# Patient Record
Sex: Female | Born: 1975
Health system: Southern US, Community
[De-identification: ages and names within clinical notes are randomized; demographics above are authoritative.]

## PROBLEM LIST (undated history)

## (undated) ENCOUNTER — Inpatient Hospital Stay (HOSPITAL_COMMUNITY): Payer: Self-pay

## (undated) DIAGNOSIS — R87629 Unspecified abnormal cytological findings in specimens from vagina: Secondary | ICD-10-CM

## (undated) DIAGNOSIS — R000 Tachycardia, unspecified: Secondary | ICD-10-CM

## (undated) DIAGNOSIS — I471 Supraventricular tachycardia, unspecified: Secondary | ICD-10-CM

## (undated) HISTORY — PX: LEEP: SHX91

---

## 1997-09-23 ENCOUNTER — Other Ambulatory Visit: Admission: RE | Admit: 1997-09-23 | Discharge: 1997-09-23 | Payer: Self-pay | Admitting: *Deleted

## 2004-10-05 ENCOUNTER — Emergency Department (HOSPITAL_COMMUNITY): Admission: EM | Admit: 2004-10-05 | Discharge: 2004-10-06 | Payer: Self-pay | Admitting: Emergency Medicine

## 2006-03-10 ENCOUNTER — Emergency Department (HOSPITAL_COMMUNITY): Admission: EM | Admit: 2006-03-10 | Discharge: 2006-03-11 | Payer: Self-pay | Admitting: Emergency Medicine

## 2007-02-25 ENCOUNTER — Inpatient Hospital Stay (HOSPITAL_COMMUNITY): Admission: AD | Admit: 2007-02-25 | Discharge: 2007-02-25 | Payer: Self-pay | Admitting: Obstetrics and Gynecology

## 2008-08-08 ENCOUNTER — Emergency Department (HOSPITAL_COMMUNITY): Admission: EM | Admit: 2008-08-08 | Discharge: 2008-08-08 | Payer: Self-pay | Admitting: Emergency Medicine

## 2009-04-12 ENCOUNTER — Inpatient Hospital Stay (HOSPITAL_COMMUNITY): Admission: AD | Admit: 2009-04-12 | Discharge: 2009-04-15 | Payer: Self-pay | Admitting: Obstetrics and Gynecology

## 2010-03-12 ENCOUNTER — Other Ambulatory Visit: Payer: Self-pay | Admitting: Obstetrics and Gynecology

## 2010-03-12 ENCOUNTER — Inpatient Hospital Stay (HOSPITAL_COMMUNITY)
Admission: AD | Admit: 2010-03-12 | Discharge: 2010-03-14 | DRG: 775 | Disposition: A | Discharge status: Home / Self Care | Payer: Self-pay | Source: Ambulatory Visit | Attending: Obstetrics and Gynecology | Admitting: Obstetrics and Gynecology

## 2010-03-12 ENCOUNTER — Inpatient Hospital Stay (HOSPITAL_COMMUNITY): Payer: Self-pay

## 2010-03-12 DIAGNOSIS — O4290 Premature rupture of membranes, unspecified as to length of time between rupture and onset of labor, unspecified weeks of gestation: Principal | ICD-10-CM | POA: Diagnosis present

## 2010-03-12 DIAGNOSIS — O99892 Other specified diseases and conditions complicating childbirth: Secondary | ICD-10-CM | POA: Diagnosis present

## 2010-03-12 DIAGNOSIS — O320XX0 Maternal care for unstable lie, not applicable or unspecified: Secondary | ICD-10-CM | POA: Diagnosis present

## 2010-03-12 DIAGNOSIS — O9903 Anemia complicating the puerperium: Secondary | ICD-10-CM | POA: Diagnosis not present

## 2010-03-12 DIAGNOSIS — D649 Anemia, unspecified: Secondary | ICD-10-CM | POA: Diagnosis not present

## 2010-03-12 DIAGNOSIS — Z22330 Carrier of Group B streptococcus: Secondary | ICD-10-CM

## 2010-03-12 LAB — URINALYSIS, DIPSTICK ONLY
Bilirubin Urine: NEGATIVE
Hgb urine dipstick: NEGATIVE
Ketones, ur: >80 mg/dL — AB
Leukocytes, UA: NEGATIVE
Nitrite: NEGATIVE
Protein, ur: NEGATIVE mg/dL
Specific Gravity, Urine: 1.02 (ref 1.005–1.030)
Urine Glucose, Fasting: NEGATIVE mg/dL
Urobilinogen, UA: 0.2 mg/dL (ref 0.0–1.0)
pH: 6.5 (ref 5.0–8.0)

## 2010-03-12 LAB — AMNISURE RUPTURE OF MEMBRANE (ROM) NOT AT ARMC: Amnisure ROM: POSITIVE

## 2010-03-14 LAB — CBC
HCT: 31.3 % — ABNORMAL LOW (ref 36.0–46.0)
Hemoglobin: 9.9 g/dL — ABNORMAL LOW (ref 12.0–15.0)
MCH: 28.4 pg (ref 26.0–34.0)
MCHC: 31.6 g/dL (ref 30.0–36.0)
MCV: 89.7 fL (ref 78.0–100.0)
Platelets: 191 10*3/uL (ref 150–400)
RBC: 3.49 MIL/uL — ABNORMAL LOW (ref 3.87–5.11)
RDW: 13.5 % (ref 11.5–15.5)
WBC: 5.5 10*3/uL (ref 4.0–10.5)

## 2010-03-14 LAB — RPR: RPR Ser Ql: NONREACTIVE

## 2010-03-26 ENCOUNTER — Inpatient Hospital Stay (HOSPITAL_COMMUNITY): Admission: AD | Admit: 2010-03-26 | Payer: Self-pay | Source: Home / Self Care | Admitting: Obstetrics and Gynecology

## 2010-03-28 LAB — DIFFERENTIAL
Basophils Absolute: 0 10*3/uL (ref 0.0–0.1)
Basophils Relative: 0 % (ref 0–1)
Eosinophils Absolute: 0 10*3/uL (ref 0.0–0.7)
Eosinophils Relative: 0 % (ref 0–5)
Lymphocytes Relative: 7 % — ABNORMAL LOW (ref 12–46)
Lymphs Abs: 0.3 10*3/uL — ABNORMAL LOW (ref 0.7–4.0)
Monocytes Absolute: 0.2 10*3/uL (ref 0.1–1.0)
Monocytes Relative: 4 % (ref 3–12)
Neutro Abs: 3.8 10*3/uL (ref 1.7–7.7)
Neutrophils Relative %: 89 % — ABNORMAL HIGH (ref 43–77)

## 2010-03-28 LAB — CBC
HCT: 33.3 % — ABNORMAL LOW (ref 36.0–46.0)
Hemoglobin: 10.8 g/dL — ABNORMAL LOW (ref 12.0–15.0)
MCH: 29 pg (ref 26.0–34.0)
MCHC: 32.4 g/dL (ref 30.0–36.0)
MCV: 89.3 fL (ref 78.0–100.0)
Platelets: 187 10*3/uL (ref 150–400)
RBC: 3.73 MIL/uL — ABNORMAL LOW (ref 3.87–5.11)
RDW: 13.2 % (ref 11.5–15.5)
WBC: 4.3 10*3/uL (ref 4.0–10.5)

## 2010-03-28 LAB — COMPREHENSIVE METABOLIC PANEL
ALT: 11 U/L (ref 0–35)
AST: 16 U/L (ref 0–37)
Albumin: 2.7 g/dL — ABNORMAL LOW (ref 3.5–5.2)
Alkaline Phosphatase: 117 U/L (ref 39–117)
BUN: 7 mg/dL (ref 6–23)
CO2: 22 mEq/L (ref 19–32)
Calcium: 8.6 mg/dL (ref 8.4–10.5)
Chloride: 106 mEq/L (ref 96–112)
Creatinine, Ser: 0.5 mg/dL (ref 0.4–1.2)
GFR calc Af Amer: >60 mL/min (ref >60)
GFR calc non Af Amer: >60 mL/min (ref >60)
Glucose, Bld: 88 mg/dL (ref 70–99)
Potassium: 4.1 mEq/L (ref 3.5–5.1)
Sodium: 136 mEq/L (ref 135–145)
Total Bilirubin: 0.9 mg/dL (ref 0.3–1.2)
Total Protein: 6 g/dL (ref 6.0–8.3)

## 2010-03-28 LAB — RPR: RPR Ser Ql: NONREACTIVE

## 2010-04-17 LAB — CBC
HCT: 27.9 % — ABNORMAL LOW (ref 36.0–46.0)
HCT: 35.6 % — ABNORMAL LOW (ref 36.0–46.0)
Hemoglobin: 11.6 g/dL — ABNORMAL LOW (ref 12.0–15.0)
Hemoglobin: 9.5 g/dL — ABNORMAL LOW (ref 12.0–15.0)
MCHC: 32.7 g/dL (ref 30.0–36.0)
MCHC: 33.9 g/dL (ref 30.0–36.0)
MCV: 90.4 fL (ref 78.0–100.0)
MCV: 90.9 fL (ref 78.0–100.0)
Platelets: 194 10*3/uL (ref 150–400)
Platelets: 247 10*3/uL (ref 150–400)
RBC: 3.07 MIL/uL — ABNORMAL LOW (ref 3.87–5.11)
RBC: 3.94 MIL/uL (ref 3.87–5.11)
RDW: 14.4 % (ref 11.5–15.5)
RDW: 14.5 % (ref 11.5–15.5)
WBC: 5.9 10*3/uL (ref 4.0–10.5)
WBC: 8.8 10*3/uL (ref 4.0–10.5)

## 2010-04-17 LAB — CCBB MATERNAL DONOR DRAW

## 2010-04-17 LAB — RPR: RPR Ser Ql: NONREACTIVE

## 2010-05-01 LAB — POCT URINALYSIS DIP (DEVICE)
Bilirubin Urine: NEGATIVE
Glucose, UA: NEGATIVE mg/dL
Hgb urine dipstick: NEGATIVE
Ketones, ur: NEGATIVE mg/dL
Nitrite: NEGATIVE
Protein, ur: NEGATIVE mg/dL
Specific Gravity, Urine: 1.015 (ref 1.005–1.030)
Urobilinogen, UA: 0.2 mg/dL (ref 0.0–1.0)
pH: 7.5 (ref 5.0–8.0)

## 2010-05-01 LAB — POCT PREGNANCY, URINE: Preg Test, Ur: POSITIVE

## 2010-10-14 LAB — URINALYSIS, ROUTINE W REFLEX MICROSCOPIC
Bilirubin Urine: NEGATIVE
Glucose, UA: NEGATIVE
Hgb urine dipstick: NEGATIVE
Ketones, ur: 15 — AB
Nitrite: NEGATIVE
Protein, ur: NEGATIVE
Specific Gravity, Urine: 1.015
Urobilinogen, UA: 0.2
pH: 7.5

## 2010-10-14 LAB — POCT PREGNANCY, URINE
Operator id: 120561
Preg Test, Ur: NEGATIVE

## 2012-04-29 ENCOUNTER — Other Ambulatory Visit (HOSPITAL_COMMUNITY)
Admission: RE | Admit: 2012-04-29 | Discharge: 2012-04-29 | Disposition: A | Discharge status: Home / Self Care | Payer: 59 | Source: Ambulatory Visit | Attending: Family Medicine | Admitting: Family Medicine

## 2012-04-29 ENCOUNTER — Other Ambulatory Visit: Payer: Self-pay | Admitting: Family Medicine

## 2012-04-29 DIAGNOSIS — Z0000 Encounter for general adult medical examination without abnormal findings: Secondary | ICD-10-CM | POA: Insufficient documentation

## 2013-03-05 ENCOUNTER — Other Ambulatory Visit: Payer: Self-pay | Admitting: Obstetrics and Gynecology

## 2013-11-11 LAB — OB RESULTS CONSOLE ANTIBODY SCREEN: Antibody Screen: NEGATIVE

## 2013-11-11 LAB — OB RESULTS CONSOLE RPR: RPR: NONREACTIVE

## 2013-11-11 LAB — OB RESULTS CONSOLE HIV ANTIBODY (ROUTINE TESTING): HIV: NONREACTIVE

## 2013-11-11 LAB — OB RESULTS CONSOLE ABO/RH: RH Type: POSITIVE

## 2013-11-27 ENCOUNTER — Other Ambulatory Visit: Payer: Self-pay | Admitting: Obstetrics and Gynecology

## 2013-11-27 ENCOUNTER — Other Ambulatory Visit (HOSPITAL_COMMUNITY)
Admission: RE | Admit: 2013-11-27 | Discharge: 2013-11-27 | Disposition: A | Discharge status: Home / Self Care | Payer: 59 | Source: Ambulatory Visit | Attending: Obstetrics and Gynecology | Admitting: Obstetrics and Gynecology

## 2013-11-27 DIAGNOSIS — Z1151 Encounter for screening for human papillomavirus (HPV): Secondary | ICD-10-CM | POA: Insufficient documentation

## 2013-11-27 DIAGNOSIS — Z01411 Encounter for gynecological examination (general) (routine) with abnormal findings: Secondary | ICD-10-CM | POA: Insufficient documentation

## 2013-12-01 LAB — CYTOLOGY - PAP

## 2013-12-25 LAB — OB RESULTS CONSOLE HEPATITIS B SURFACE ANTIGEN: Hepatitis B Surface Ag: NEGATIVE

## 2013-12-25 LAB — OB RESULTS CONSOLE GC/CHLAMYDIA: Gonorrhea: NEGATIVE

## 2014-01-21 ENCOUNTER — Encounter (HOSPITAL_COMMUNITY): Payer: Self-pay | Admitting: *Deleted

## 2014-01-21 ENCOUNTER — Inpatient Hospital Stay (HOSPITAL_COMMUNITY)
Admission: AD | Admit: 2014-01-21 | Discharge: 2014-01-21 | Disposition: A | Discharge status: Home / Self Care | Payer: 59 | Source: Ambulatory Visit | Attending: Obstetrics and Gynecology | Admitting: Obstetrics and Gynecology

## 2014-01-21 DIAGNOSIS — O99412 Diseases of the circulatory system complicating pregnancy, second trimester: Secondary | ICD-10-CM | POA: Insufficient documentation

## 2014-01-21 DIAGNOSIS — Z3A18 18 weeks gestation of pregnancy: Secondary | ICD-10-CM | POA: Insufficient documentation

## 2014-01-21 DIAGNOSIS — I471 Supraventricular tachycardia: Secondary | ICD-10-CM | POA: Diagnosis not present

## 2014-01-21 DIAGNOSIS — R002 Palpitations: Secondary | ICD-10-CM | POA: Diagnosis present

## 2014-01-21 HISTORY — DX: Unspecified abnormal cytological findings in specimens from vagina: R87.629

## 2014-01-21 HISTORY — DX: Tachycardia, unspecified: R00.0

## 2014-01-21 LAB — COMPREHENSIVE METABOLIC PANEL
ALT: 14 U/L (ref 0–35)
AST: 17 U/L (ref 0–37)
Albumin: 3.2 g/dL — ABNORMAL LOW (ref 3.5–5.2)
Alkaline Phosphatase: 50 U/L (ref 39–117)
Anion gap: 7 (ref 5–15)
BUN: 9 mg/dL (ref 6–23)
CO2: 25 mmol/L (ref 19–32)
Calcium: 9 mg/dL (ref 8.4–10.5)
Chloride: 104 mEq/L (ref 96–112)
Creatinine, Ser: 0.43 mg/dL — ABNORMAL LOW (ref 0.50–1.10)
GFR calc Af Amer: >90 mL/min (ref >90)
GFR calc non Af Amer: >90 mL/min (ref >90)
Glucose, Bld: 99 mg/dL (ref 70–99)
Potassium: 3.7 mmol/L (ref 3.5–5.1)
Sodium: 136 mmol/L (ref 135–145)
Total Bilirubin: 0.6 mg/dL (ref 0.3–1.2)
Total Protein: 6.9 g/dL (ref 6.0–8.3)

## 2014-01-21 LAB — URINALYSIS, ROUTINE W REFLEX MICROSCOPIC
Bilirubin Urine: NEGATIVE
Glucose, UA: NEGATIVE mg/dL
Hgb urine dipstick: NEGATIVE
Ketones, ur: NEGATIVE mg/dL
Leukocytes, UA: NEGATIVE
Nitrite: NEGATIVE
Protein, ur: NEGATIVE mg/dL
Specific Gravity, Urine: 1.01 (ref 1.005–1.030)
Urobilinogen, UA: 0.2 mg/dL (ref 0.0–1.0)
pH: 7 (ref 5.0–8.0)

## 2014-01-21 LAB — CBC
HCT: 31.8 % — ABNORMAL LOW (ref 36.0–46.0)
Hemoglobin: 10.4 g/dL — ABNORMAL LOW (ref 12.0–15.0)
MCH: 28.4 pg (ref 26.0–34.0)
MCHC: 32.7 g/dL (ref 30.0–36.0)
MCV: 86.9 fL (ref 78.0–100.0)
Platelets: 214 10*3/uL (ref 150–400)
RBC: 3.66 MIL/uL — ABNORMAL LOW (ref 3.87–5.11)
RDW: 13.6 % (ref 11.5–15.5)
WBC: 6 10*3/uL (ref 4.0–10.5)

## 2014-01-21 LAB — TSH: TSH: 1.241 u[IU]/mL (ref 0.350–4.500)

## 2014-01-21 MED ORDER — LACTATED RINGERS IV BOLUS (SEPSIS)
1000.0000 mL | Freq: Once | INTRAVENOUS | Status: AC
  Administered 2014-01-21: 1000 mL via INTRAVENOUS

## 2014-01-21 MED ORDER — ADENOSINE 6 MG/2ML IV SOLN
6.0000 mg | Freq: Once | INTRAVENOUS | Status: AC
  Administered 2014-01-21: 6 mg via INTRAVENOUS
  Filled 2014-01-21: qty 2

## 2014-01-21 NOTE — MAU Note (Signed)
EKG done on pt's arrival to MAU, cardiac monitor on @ present.  Pt denies any pain.  Pt states she feels weak right now, denies any dizziness.

## 2014-01-21 NOTE — Significant Event (Signed)
Rapid Response Event Note  Overview:  Called to MAU to assist cardiologist with Adenosine cardioversion     Initial Focused Assessment:    Pt alert & oriented, no complaints at this time. Procedure explained by cardiologist.   Interventions:  Adenosine 6 mg IV - pt converted from ST @148  to ST 110. Post EKG done.   Event Summary:  Pt to be d/c'd home.   at      [facility]          [facility] Maximino Green[facility] [place]

## 2014-01-21 NOTE — MAU Note (Signed)
Cardiologist in to see pt  

## 2014-01-21 NOTE — MAU Note (Signed)
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  Pt denies chest pain, arm/neck/jaw pain.  States she had episode like this with other pregnancy, pt is now 18 weeks.    Collene GobbleLisa Leftwich , CNM at bedside, initial vital were Pulse 144 with oxygen sat of 100%, BP 106/70, labs , EKG ordered, Dr. Malen GauzeFoster also at bedside within a few minutes of arrival. Pt's MD notified as well.

## 2014-01-21 NOTE — MAU Provider Note (Signed)
Chief Complaint: Tachycardia   None    SUBJECTIVE HPI: Holly [name] is a 38 y.o. [id number] @[redacted]w[redacted]d  pt of [facility] who presents to maternity admissions reporting heart palpitations.  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 She did not experience as syncopal episode, but rapid response nurse was contacted and pt brought to MAU for further evaluation. The pt reports no cardiac history outside of pregnancy but did have palpitations with her last pregnancy which were not evaluated by cardiology.  Upon further questioning, she does report some occasional feelings of heart racing that resolve easily when not pregnant as well.  She reports good fetal movement, denies LOF, vaginal bleeding, vaginal itching/burning, urinary symptoms, h/a, n/v, or fever/chills.    Past Medical History  Diagnosis Date  . Tachycardia   . Vaginal Pap smear, abnormal    Past Surgical History  Procedure Laterality Date  . Leep     History   Social History  . Marital Status: Married    Spouse Name: N/A    Number of Children: N/A  . Years of Education: N/A   Occupational History  . Not on file.   Social History Main Topics  . Smoking status: Never Smoker   . Smokeless tobacco: Not on file  . Alcohol Use: No  . Drug Use: No  . Sexual Activity: Yes   Other Topics Concern  . Not on file   Social History Narrative  . No narrative on file   No current facility-administered medications on file prior to encounter.   No current outpatient prescriptions on file prior to encounter.   Allergies  Allergen Reactions  . Amoxicillin Itching    ROS: Pertinent items in HPI  OBJECTIVE Blood pressure 116/71, pulse 102, temperature 98.1 F (36.7 C), temperature source Oral, resp. rate 18, SpO2 100 %. GENERAL: Well-developed, well-nourished female in mild distress.   HEENT: Normocephalic HEART: tachycardia, regular rate, no audible murmer or abnormal heart sounds RESP: normal effort, lung sounds clear and equal bilaterally ABDOMEN: Soft, non-tender EXTREMITIES: Nontender, no edema NEURO: Alert and oriented SPECULUM EXAM: Deferred  FHR 140s, FHR confirmed by bedside sono by Dr Malen GauzeFoster  LAB RESULTS Results for orders placed or performed during the hospital encounter of 01/21/14 (from the past 24 hour(s))  CBC     Status: Abnormal   Collection Time: 01/21/14 10:06 AM  Result Value Ref Range   WBC 6.0 4.0 - 10.5 K/uL   RBC 3.66 (L) 3.87 - 5.11 MIL/uL   Hemoglobin 10.4 (L) 12.0 - 15.0 g/dL   HCT 45.431.8 (L) 09.836.0 - 11.946.0 %   MCV 86.9 78.0 - 100.0 fL   MCH 28.4 26.0 - 34.0 pg   MCHC 32.7 30.0 - 36.0 g/dL   RDW 14.713.6 82.911.5 - 56.215.5 %   Platelets 214 150 - 400 K/uL  Comprehensive metabolic panel     Status: Abnormal   Collection Time: 01/21/14 10:06 AM  Result Value Ref Range   Sodium 136 135 - 145 mmol/L   Potassium 3.7 3.5 - 5.1 mmol/L   Chloride 104 96 - 112 mEq/L   CO2 25 19 - 32 mmol/L   Glucose, Bld 99 70 - 99 mg/dL   BUN 9 6 - 23 mg/dL   Creatinine, Ser 1.300.43 (L) 0.50 - 1.10 mg/dL   Calcium 9.0 8.4 - 86.510.5 mg/dL  Total Protein 6.9 6.0 - 8.3 g/dL   Albumin 3.2 (L) 3.5 - 5.2 g/dL   AST 17 0 - 37 U/L   ALT 14 0 - 35 U/L   Alkaline Phosphatase 50 39 - 117 U/L   Total Bilirubin 0.6 0.3 - 1.2 mg/dL   GFR calc non Af Amer >90 >90 mL/min   GFR calc Af Amer >90 >90 mL/min   Anion gap 7 5 - 15  Urinalysis, Routine w reflex microscopic     Status: None   Collection Time: 01/21/14 11:42 AM  Result Value Ref Range   Color, Urine YELLOW YELLOW   APPearance CLEAR CLEAR   Specific Gravity, Urine 1.010 1.005 - 1.030   pH 7.0 5.0 - 8.0   Glucose, UA NEGATIVE NEGATIVE mg/dL   Hgb urine dipstick NEGATIVE NEGATIVE   Bilirubin Urine NEGATIVE NEGATIVE   Ketones, ur NEGATIVE NEGATIVE mg/dL   Protein, ur NEGATIVE NEGATIVE mg/dL   Urobilinogen, UA 0.2 0.0 -  1.0 mg/dL   Nitrite NEGATIVE NEGATIVE   Leukocytes, UA NEGATIVE NEGATIVE   TSH pending  MAU Management EKG, CBC, CMP, TSH, LR bolus x 1000 ml  EKG evaluated by Dr Malen GauzeFoster, anesthesiologist.  Likely reentry tachycardia.    Cardiologist Dr Patty SermonsBrackbill to room to evaluate.  See notes in Epic.  Successful cardioversion with adenosine iv.  Pt tolerated procedure well.  ASSESSMENT 1. PSVT (paroxysmal supraventricular tachycardia)   2. [redacted] weeks gestation of pregnancy     PLAN Consult Dr Dion BodyVarnado Consult Cardiology.  Cardiology to bedside to assist with cardioversion. See notes in chart. Discharge home F/U with Dr Dion BodyVarnado Rest today, do not return to work.  Ok to return to work tomorrow if not symptomatic per Dr Charlestine MassedBlackbill Return to MAU as needed for emergencies    Medication List    TAKE these medications        prenatal multivitamin Tabs tablet  Take 1 tablet by mouth daily at 12 noon.       Follow-up Information    Follow up with [name], [name], MD.   Specialty:  Obstetrics and Gynecology   Why:  As scheduled   Contact information:   [address] [place] [address] [phone]       [facility] with [facility].   Why:  As needed for emergencies   Contact information:   [address] [id number] mc [place] [id number] [phone]      Sharen CounterLisa Leftwich-Kirby Certified Nurse-Midwife 01/21/2014  12:01 PM

## 2014-01-21 NOTE — MAU Note (Signed)
Jose Persiaarol Mead RR RN @ bedside, adenosine administered by Dr. Charlestine MassedBlackbill.

## 2014-01-21 NOTE — Consult Note (Signed)
CARDIOLOGY CONSULT NOTE   Patient ID: Ciro BackerChristina L Im MRN: [id number], DOB/AGE: July 07, 1975   Admit date: 01/21/2014 Date of Consult: 01/21/2014   Primary Physician: No primary care provider on file. Primary Cardiologist: None  Pt. Profile  38 yo women with PSVT.   Problem List  Past Medical History  Diagnosis Date  . Tachycardia   . Vaginal Pap smear, abnormal     Past Surgical History  Procedure Laterality Date  . Leep       Allergies  Allergies  Allergen Reactions  . Amoxicillin Itching    HPI   38 year old woman who is [redacted] weeks pregnant .  She has a past history of PSVT. Last prior episode one month ago lasted all day, resolved during sleep.  Today onset while having breakfast at hospital. 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 EKG shows PSVT at 145/min.  No chest pain but feels dizzy when she is in PSVT.  Inpatient Medications  . adenosine (ADENOCARD) IV  6 mg Intravenous Once  . lactated ringers  1,000 mL Intravenous Once    Family History History reviewed. No pertinent family history. No history of SVT in the family.  Social History History   Social History  . Marital Status: Married    Spouse Name: N/A    Number of Children: N/A  . Years of Education: N/A   Occupational History  . Not on file.   Social History Main Topics  . Smoking status: Never Smoker   . Smokeless tobacco: Not on file  . Alcohol Use: No  . Drug Use: No  . Sexual Activity: Yes   Other Topics Concern  . Not on file   Social History Narrative  . No narrative on file     Review of Systems  General:  No chills, fever, night sweats or weight changes.  Cardiovascular:  No chest pain, dyspnea on exertion, edema, orthopnea, palpitations, paroxysmal nocturnal dyspnea. Dermatological: No rash, lesions/masses Respiratory: No cough, dyspnea Urologic: No hematuria, dysuria Abdominal:   No nausea, vomiting, diarrhea, bright red blood per rectum, melena, or  hematemesis Neurologic:  No visual changes, wkns, changes in mental status. All other systems reviewed and are otherwise negative except as noted above.  Physical Exam  Blood pressure 129/86, pulse 156, temperature 98.1 F (36.7 C), temperature source Oral, resp. rate 20, SpO2 100 %.  General: Pleasant, NAD Psych: Normal affect. Neuro: Alert and oriented X 3. Moves all extremities spontaneously. HEENT: Normal  Neck: Supple without bruits or JVD. Lungs:  Resp regular and unlabored, CTA. Heart: RRR no s3, s4, or murmurs. Abdomen: Soft, non-tender, non-distended, BS + x 4.  Extremities: No clubbing, cyanosis or edema. DP/PT/Radials 2+ and equal bilaterally.  Labs  No results for input(s): CKTOTAL, CKMB, TROPONINI in the last 72 hours. Lab Results  Component Value Date   WBC 6.0 01/21/2014   HGB 10.4* 01/21/2014   HCT 31.8* 01/21/2014   MCV 86.9 01/21/2014   PLT 214 01/21/2014     Recent Labs Lab 01/21/14 1006  NA 136  K 3.7  CL 104  CO2 25  BUN 9  CREATININE 0.43*  CALCIUM 9.0  PROT 6.9  BILITOT 0.6  ALKPHOS 50  ALT 14  AST 17  GLUCOSE 99   No results found for: CHOL, HDL, LDLCALC, TRIG No results found for: DDIMER  Radiology/Studies  No results found.  ECG  Paroxysmal SVT with retrograde P waves.  ASSESSMENT AND PLAN  1.Paroxysmal  SVT 2. Intrauterine pregnancy  Disposition: We tried carotid sinus massage with no effect on her heart rhythm.  Therefore we proceeded with administration of adenocard 6 mg IV push.  She converted to NSR without significant side effects. Post- procedure EKG confirms NSR. Advised to continue to avoid caffeine and to get adequate rest, avoid stress etc. Recheck with cardiology after pregnancy, or sooner PRN. Would not add any Beta blocker or diltiazem chronically at this time out of consideration for the fetus. This could be added later if episodes continue after pregnancy.   Signed, Cassell Clementhomas Talmadge Ganas, MD  01/21/2014, 11:11  AM

## 2014-01-21 NOTE — Discharge Instructions (Signed)
Supraventricular Tachycardia °Supraventricular tachycardia (SVT) is an abnormal heart rhythm (arrhythmia) that causes the heart to beat very fast (tachycardia). This kind of fast heartbeat originates in the upper chambers of the heart (atria). SVT can cause the heart to beat greater than 100 beats per minute. SVT can have a rapid burst of heartbeats. This can start and stop suddenly without warning and is called nonsustained. SVT can also be sustained, in which the heart beats at a continuous fast rate.  °CAUSES  °There can be different causes of SVT. Some of these include: °· Heart valve problems such as mitral valve prolapse. °· An enlarged heart (hypertrophic cardiomyopathy). °· Congenital heart problems. °· Heart inflammation (pericarditis). °· Hyperthyroidism. °· Low potassium or magnesium levels. °· Caffeine. °· Drug use such as cocaine, methamphetamines, or stimulants. °· Some over-the-counter medicines such as: °¨ Decongestants. °¨ Diet medicines. °¨ Herbal medicines. °SYMPTOMS  °Symptoms of SVT can vary. Symptoms depend on whether the SVT is sustained or nonsustained. You may experience: °· No symptoms (asymptomatic). °· An awareness of your heart beating rapidly (palpitations). °· Shortness of breath. °· Chest pain or pressure. °If your blood pressure drops because of the SVT, you may experience: °· Fainting or near fainting. °· Weakness. °· Dizziness. °DIAGNOSIS  °Different tests can be performed to diagnose SVT, such as: °· An electrocardiogram (EKG). This is a painless test that records the electrical activity of your heart. °· Holter monitor. This is a 24 hour recording of your heart rhythm. You will be given a diary. Write down all symptoms that you have and what you were doing at the time you experienced symptoms. °· Arrhythmia monitor. This is a small device that your wear for several weeks. It records the heart rhythm when you have symptoms. °· Echocardiogram. This is an imaging test to help detect  abnormal heart structure such as congenital abnormalities, heart valve problems, or heart enlargement. °· Stress test. This test can help determine if the SVT is related to exercise. °· Electrophysiology study (EPS). This is a procedure that evaluates your heart's electrical system and can help your caregiver find the cause of your SVT. °TREATMENT  °Treatment of SVT depends on the symptoms, how often it recurs, and whether there are any underlying heart problems.  °· If symptoms are rare and no other cardiac disease is present, no treatment may be needed. °· Blood work may be done to check potassium, magnesium, and thyroid hormone levels to see if they are abnormal. If these levels are abnormal, treatment to correct the problems will occur. °Medicines °Your caregiver may use oral medicines to treat SVT. These medicines are given for long-term control of SVT. Medicines may be used alone or in combination with other treatments. These medicines work to slow nerve impulses in the heart muscle. These medicines can also be used to treat high blood pressure. Some of these medicines may include: °· Calcium channel blockers. °· Beta blockers. °· Digoxin. °Nonsurgical procedures °Nonsurgical techniques may be used if oral medicines do not work. Some examples include: °· Cardioversion. This technique uses either drugs or an electrical shock to restore a normal heart rhythm. °¨ Cardioversion drugs may be given through an intravenous (IV) line to help "reset" the heart rhythm. °¨ In electrical cardioversion, the caregiver shocks your heart to stop its beat for a split second. This helps to reset the heart to a normal rhythm. °· Ablation. This procedure is done under mild sedation. High frequency radio wave energy is used to   destroy the area of heart tissue responsible for the SVT. °HOME CARE INSTRUCTIONS  °· Do not smoke. °· Only take medicines prescribed by your caregiver. Check with your caregiver before using over-the-counter  medicines. °· Check with your caregiver about how much alcohol and caffeine (coffee, tea, colas, or chocolate) you may have. °· It is very important to keep all follow-up referrals and appointments in order to properly manage this problem. °SEEK IMMEDIATE MEDICAL CARE IF: °· You have dizziness. °· You faint or nearly faint. °· You have shortness of breath. °· You have chest pain or pressure. °· You have sudden nausea or vomiting. °· You have profuse sweating. °· You are concerned about how long your symptoms last. °· You are concerned about the frequency of your SVT episodes. °If you have the above symptoms, call your local emergency services (911 in [place] immediately. Do not drive yourself to the hospital. °MAKE SURE YOU:  °· Understand these instructions. °· Will watch your condition. °· Will get help right away if you are not doing well or get worse. °Document Released: 01/09/2005 Document Revised: 04/03/2011 Document Reviewed: 04/23/2008 °ExitCare® Patient Information ©2015 [employer]. This information is not intended to replace advice given to you by your health care provider. Make sure you discuss any questions you have with your health care provider. ° °

## 2014-01-23 NOTE — L&D Delivery Note (Signed)
Delivery Note At 7:50 AM a viable female was delivered via Vaginal, Spontaneous Delivery (Presentation: LOA  ).  APGAR: 8, 9; weight 6 lb 12.6 oz (3080 g).   Placenta status: Intact, Spontaneous.  Cord: 3 vessels with the following complications: None.  Anesthesia: Local  Episiotomy: None Lacerations: 2nd degree;Perineal Suture Repair: 2.0 3.0 chromic Est. Blood Loss (mL): 694  Mom to postpartum.  Baby to Couplet care / Skin to Skin.  [name], Conswella Bruney, M 06/18/2014, 12:11 PM

## 2014-01-29 ENCOUNTER — Other Ambulatory Visit: Payer: Self-pay | Admitting: Obstetrics and Gynecology

## 2014-01-29 DIAGNOSIS — Z3689 Encounter for other specified antenatal screening: Secondary | ICD-10-CM

## 2014-02-04 ENCOUNTER — Other Ambulatory Visit: Payer: Self-pay | Admitting: Obstetrics and Gynecology

## 2014-02-04 ENCOUNTER — Ambulatory Visit (HOSPITAL_COMMUNITY)
Admission: RE | Admit: 2014-02-04 | Discharge: 2014-02-04 | Disposition: A | Discharge status: Home / Self Care | Payer: 59 | Source: Ambulatory Visit | Attending: Obstetrics and Gynecology | Admitting: Obstetrics and Gynecology

## 2014-02-04 DIAGNOSIS — Z36 Encounter for antenatal screening of mother: Secondary | ICD-10-CM | POA: Diagnosis not present

## 2014-02-04 DIAGNOSIS — Z3A20 20 weeks gestation of pregnancy: Secondary | ICD-10-CM | POA: Insufficient documentation

## 2014-02-04 DIAGNOSIS — Z3689 Encounter for other specified antenatal screening: Secondary | ICD-10-CM

## 2014-02-04 DIAGNOSIS — O09522 Supervision of elderly multigravida, second trimester: Secondary | ICD-10-CM | POA: Diagnosis not present

## 2014-02-05 ENCOUNTER — Encounter (HOSPITAL_COMMUNITY): Payer: Self-pay | Admitting: *Deleted

## 2014-02-05 ENCOUNTER — Other Ambulatory Visit: Payer: Self-pay

## 2014-02-05 ENCOUNTER — Inpatient Hospital Stay (HOSPITAL_COMMUNITY)
Admission: AD | Admit: 2014-02-05 | Discharge: 2014-02-05 | Disposition: A | Discharge status: Home / Self Care | Payer: 59 | Source: Ambulatory Visit | Attending: Obstetrics and Gynecology | Admitting: Obstetrics and Gynecology

## 2014-02-05 DIAGNOSIS — O9989 Other specified diseases and conditions complicating pregnancy, childbirth and the puerperium: Secondary | ICD-10-CM | POA: Insufficient documentation

## 2014-02-05 DIAGNOSIS — I471 Supraventricular tachycardia: Secondary | ICD-10-CM | POA: Diagnosis not present

## 2014-02-05 DIAGNOSIS — R002 Palpitations: Secondary | ICD-10-CM | POA: Diagnosis present

## 2014-02-05 DIAGNOSIS — Z3A20 20 weeks gestation of pregnancy: Secondary | ICD-10-CM | POA: Insufficient documentation

## 2014-02-05 MED ORDER — METOPROLOL TARTRATE 25 MG PO TABS: ORAL_TABLET | ORAL | Status: DC

## 2014-02-05 MED ORDER — METOPROLOL TARTRATE 25 MG PO TABS
12.5000 mg | ORAL_TABLET | Freq: Two times a day (BID) | ORAL | Status: DC
Start: 2014-02-05 — End: 2014-02-05
  Filled 2014-02-05: qty 0.5

## 2014-02-05 MED ORDER — ADENOSINE 12 MG/4ML IV SOLN
12.0000 mg | Freq: Once | INTRAVENOUS | Status: DC
  Filled 2014-02-05: qty 4

## 2014-02-05 MED ORDER — ADENOSINE 6 MG/2ML IV SOLN
6.0000 mg | Freq: Once | INTRAVENOUS | Status: AC
  Administered 2014-02-05: 6 mg via INTRAVENOUS
  Filled 2014-02-05: qty 2

## 2014-02-05 MED ORDER — SODIUM CHLORIDE 0.9 % IV SOLN: INTRAVENOUS | Status: DC

## 2014-02-05 NOTE — Progress Notes (Signed)
Dr Smith(cardiology at bedside. 6mg  adenosine ordered for pt with SVT in 140's. Bolus given and pt converted to NSR  89b/m. V/ss. 107/67 p-89 res 18.ptt stated she felt better heart not racing anymore.

## 2014-02-05 NOTE — Consult Note (Addendum)
Name: Holly [name] is a 39 y.o. female Admit date: 02/05/2014 Referring Physician:  Geryl Rankins M.D. Primary Physician:  Same Primary Cardiologist:  None  Reason for Consultation:  SVT  ASSESSMENT: 1. Recurrent paroxysmal supraventricular tachycardia (AVNRT), converted with IV adenosine 6 mg. Background history of frequent recurrent episodes of SVT that can at times last hours. Emergency room visit on 01/21/2014 resulted in IV adenosine as well 2. [redacted] week gestation  PROLONGED SERVICES ( ) WITH CONTINUOUS BEDSIDE CONTACT AND DISCUSSION WITH PRIMARY PHYSICIAN, PA, AND NURSING STAFF WAS REQUIRED.  PLAN: 1. I've spoken with Dr. Dion Body and recommended that we start low-dose beta blocker or diltiazem therapy to suppress recurrences (using the agent that would have the least potential to affect the fetus). Her episodes are frequent and today she has symptoms of chest tightness, profound weakness, and dizziness. This could be harmful if it causes fainting or low blood flow to the placenta. My recommendation low-dose beta blocker in the form of metoprolol 12.5 mg twice a day up to 25 mg twice a day as tolerated. If metoprolol is not safe in pregnancy, labetalol (100 mg twice a day) could be tried but it tends to be less effective for suppression of SVT. Diltiazem ER 120 mg per day could be considered if beta blocker is not well tolerated (assuming it is safe in pregnancy).  2. After pregnancy, and especially if she continues to have relatively frequent SVT, we should consider AV node modification for cure.   HPI: 39 year old who is [redacted] weeks pregnant presents with her second episode of prolonged PSVT within the past 2 weeks. On 01/21/2014 she was seen by Dr. Patty Sermons and IV adenosine was required to break an episode. Since that time she has had recurring episodes of tachypalpitations that'll been self terminating. Last evening this episode began around 9 PM. She can ordinarily go to  sleep and it will self terminate. When she awakened this morning she was still in the arrhythmia. She had difficulty moving around because of weakness, chest tightness, and dyspnea. She came to the emergency room at [facility] where EKG and telemetry demonstrated a heart rate of 150 bpm, regular, consistent with PSVT.  An IV was started in the right hand. 6 mg of IV adenosine was administered as a bolus. She had prompt reversion to normal sinus rhythm. Associated symptoms of chest tightness and dyspnea occurred but was short lived.  Postconversion EKG sinus rhythm, left atrial abnormality, and otherwise normal.  PMH:   Past Medical History  Diagnosis Date  . Tachycardia   . Vaginal Pap smear, abnormal     PSH:   Past Surgical History  Procedure Laterality Date  . Leep     Allergies:  Amoxicillin Prior to Admit Meds:   Prescriptions prior to admission  Medication Sig Dispense Refill Last Dose  . Prenatal Vit-Fe Fumarate-FA (PRENATAL MULTIVITAMIN) TABS tablet Take 1 tablet by mouth daily at 12 noon.   02/04/2014 at Unknown time   Fam HX:   No family history on file. Social HX:    History   Social History  . Marital Status: Married    Spouse Name: N/A    Number of Children: N/A  . Years of Education: N/A   Occupational History  . Not on file.   Social History Main Topics  . Smoking status: Never Smoker   . Smokeless tobacco: Not on file  . Alcohol Use: No  . Drug Use: No  . Sexual Activity: Yes  Other Topics Concern  . Not on file   Social History Narrative     Review of Systems: No excess caffeine intake or other stimulants.  Physical Exam: Blood pressure 90/60, pulse 150, temperature 97.7 F (36.5 C), temperature source Oral, resp. rate 18, SpO2 99 %. Weight change:    The patient is lying comfortably on a gurney onto proximally 45 in the emergency room on a bedside monitor where a narrow complex tachycardia at 150 bpm is noted. Neck exam reveals no JVD.  Carotids are 2+ and symmetric. Chest is clear to auscultation and percussion Cardiac exam reveals no murmur click rub or gallop. Abdomen is distended secondary to pregnancy. No peripheral edema is noted She is neurologically intact and oriented 3 Labs: Lab Results  Component Value Date   WBC 6.0 01/21/2014   HGB 10.4* 01/21/2014   HCT 31.8* 01/21/2014   MCV 86.9 01/21/2014   PLT 214 01/21/2014   No results for input(s): NA, K, CL, CO2, BUN, CREATININE, CALCIUM, PROT, BILITOT, ALKPHOS, ALT, AST, GLUCOSE in the last 168 hours.  Invalid input(s): LABALBU No results found for: PTT No results found for: INR, PROTIME No results found for: CKTOTAL, CKMB, CKMBINDEX, TROPONINI  No results found for: CHOL No results found for: HDL No results found for: LDLCALC No results found for: TRIG No results found for: CHOLHDL No results found for: LDLDIRECT    Radiology:  [place] Ob Detail + 14 Wk  02/04/2014   OBSTETRICAL ULTRASOUND: This exam was performed within a [facility]. The OB US report was generated in the AS system, and faxed to the ordering physician.   This report is available in the [facility] PACS. See the [facility] Obstetric US report via the Image Link.   EKG: Tracing #1: Supraventricular tachycardia with retrograde P waves noted suggesting an AV node reentrant mechanism             Tracing #2: Normal sinus rhythm restored after IV adenosine. No ECG abnormalities are noted    Lesleigh NoeSMITH III,HENRY W 02/05/2014 10:12 AM

## 2014-02-05 NOTE — MAU Note (Signed)
Pt has recen thistory of palpitations. Pt stated she started feeling palpitations last night. Got more frequent had a burning sensation in her chest sometime during the night.

## 2014-02-05 NOTE — MAU Provider Note (Signed)
  History     CSN: [id number]  Arrival date and time: 02/05/14 [id number]   First Provider Initiated Contact with Patient 02/05/14 [phone]      Chief Complaint  Patient presents with  . Palpitations   HPI Holly [name] 39 y.o. [id number] @[redacted]w[redacted]d  presents to MAU complaining of palpitations and burning in chest with exertion.  This started at 9pm last evening and she was hoping it would improve after resting for the night but it has not.  She had this happen twice previously with this pregnancy.  Once it required cardioversion and the other time it was alleviated with rest.  She is not using medication for this.  She is feeling good fetal movement and denies vaginal bleeding, abdominal pain, dysuria.  She is continuing to feel palpitations or fluttering of the heart.   OB History    Gravida Para Term Preterm AB TAB SAB Ectopic Multiple Living   4 2 2  1  1   2       Past Medical History  Diagnosis Date  . Tachycardia   . Vaginal Pap smear, abnormal     Past Surgical History  Procedure Laterality Date  . Leep      No family history on file.  History  Substance Use Topics  . Smoking status: Never Smoker   . Smokeless tobacco: Not on file  . Alcohol Use: No    Allergies:  Allergies  Allergen Reactions  . Amoxicillin Itching    Prescriptions prior to admission  Medication Sig Dispense Refill Last Dose  . Prenatal Vit-Fe Fumarate-FA (PRENATAL MULTIVITAMIN) TABS tablet Take 1 tablet by mouth daily at 12 noon.   02/04/2014 at Unknown time    ROS Pertinent ROS in HPI Physical Exam   Blood pressure 90/60, pulse 150, temperature 97.7 F (36.5 C), temperature source Oral, resp. rate 18, SpO2 99 %.  Physical Exam  Constitutional: She is oriented to person, place, and time. She appears well-developed and well-nourished.  HENT:  Head: Normocephalic and atraumatic.  Eyes: EOM are normal.  Neck: Normal range of motion.  Cardiovascular:  Tachycardic to 150  Respiratory: Effort  normal. No respiratory distress.  GI: Soft. Bowel sounds are normal. She exhibits no distension. There is no tenderness. There is no rebound.  Musculoskeletal: Normal range of motion.  Neurological: She is alert and oriented to person, place, and time.  Skin: Skin is warm and dry.  Psychiatric: She has a normal mood and affect.    MAU Course  Procedures  MDM Dr. Dion BodyVarnado directed nursing staff to obtain EKG which is significant for wide QRS tach. Pt is placed on heart monitor. Cardiology requested to come to MAU for eval and cardioversion of pt.   Cardiologist did come and assess patient.  He agrees cardioversion is appropriate and this is done with 6mg  adenosine IV.  Pt tolerated well.  EKG repeated indicating success.  Pt monitored in MAU and found to be stable.  Nurse has talked with Dr. Dion BodyVarnado and gotten okay for discharge.  Dr. Dion BodyVarnado has decided on appropriate medication to begin for pt at home and this will be prescribed direct from her office.  Assessment and Plan  A: Paroxysmal SVT  P: Discharge to home Home rx per Dr. Dion BodyVarnado Follow up in office as scheduled/as needed Patient may return to MAU as needed or if her condition were to change or worsen    Bertram Denvereague Clark, Karen E 02/05/2014, 9:28 AM

## 2014-02-09 ENCOUNTER — Telehealth: Payer: Self-pay

## 2014-02-09 NOTE — Telephone Encounter (Signed)
Patient call stating that she has been having heart palpitations, chest tightness/burning, and lightheadedness since yesterday evening around 730pm.  Patient admits that she has not been taking her medication, metoprolol, due to safety concerns for fetus.  Patient informed that although metoprolol is Category C medication, the risks of maternal compromise is greater than fetal.  Provider explained risk of continued SVT including fainting, decreased blood flow to placenta which could result in IUGR and even fetal death.  Provider strongly encouraged and recommended that patient resume medication regime as prescribed and express any further questions at next PNV.  Patient verbalized understanding and went on to state that she called off work today.  Patient informed that she would have to call office, during business hours, to get information on work excuses.  Provider informed patient that she was not guaranteed an excuse.  Patient without any other questions or concerns.  Reports active fetus.

## 2014-02-11 ENCOUNTER — Other Ambulatory Visit: Payer: Self-pay | Admitting: Obstetrics and Gynecology

## 2014-02-11 ENCOUNTER — Inpatient Hospital Stay (HOSPITAL_COMMUNITY)
Admission: AD | Admit: 2014-02-11 | Discharge: 2014-02-12 | Disposition: A | Discharge status: Home / Self Care | Payer: 59 | Source: Ambulatory Visit | Attending: Obstetrics and Gynecology | Admitting: Obstetrics and Gynecology

## 2014-02-11 ENCOUNTER — Encounter (HOSPITAL_COMMUNITY): Payer: Self-pay | Admitting: *Deleted

## 2014-02-11 DIAGNOSIS — Z3A21 21 weeks gestation of pregnancy: Secondary | ICD-10-CM | POA: Diagnosis not present

## 2014-02-11 DIAGNOSIS — Z0489 Encounter for examination and observation for other specified reasons: Secondary | ICD-10-CM

## 2014-02-11 DIAGNOSIS — O9989 Other specified diseases and conditions complicating pregnancy, childbirth and the puerperium: Secondary | ICD-10-CM | POA: Diagnosis not present

## 2014-02-11 DIAGNOSIS — I471 Supraventricular tachycardia: Secondary | ICD-10-CM | POA: Diagnosis not present

## 2014-02-11 DIAGNOSIS — IMO0002 Reserved for concepts with insufficient information to code with codable children: Secondary | ICD-10-CM

## 2014-02-11 DIAGNOSIS — R002 Palpitations: Secondary | ICD-10-CM | POA: Diagnosis present

## 2014-02-11 HISTORY — DX: Supraventricular tachycardia, unspecified: I47.10

## 2014-02-11 HISTORY — DX: Supraventricular tachycardia: I47.1

## 2014-02-11 LAB — URINALYSIS, ROUTINE W REFLEX MICROSCOPIC
Bilirubin Urine: NEGATIVE
Glucose, UA: NEGATIVE mg/dL
Hgb urine dipstick: NEGATIVE
Ketones, ur: NEGATIVE mg/dL
Leukocytes, UA: NEGATIVE
Nitrite: NEGATIVE
Protein, ur: NEGATIVE mg/dL
Specific Gravity, Urine: 1.02 (ref 1.005–1.030)
Urobilinogen, UA: 0.2 mg/dL (ref 0.0–1.0)
pH: 6 (ref 5.0–8.0)
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
## 2014-02-11 NOTE — MAU Provider Note (Signed)
History     CSN: [id number]  Arrival date and time: 02/11/14 2256   First Provider Initiated Contact with Patient 02/11/14 2346      Chief Complaint  Patient presents with  . Palpitations   HPI  Ms. Holly [name] is a 39 y.o. G1P0 at [redacted]w[redacted]d who presents to MAU today with complaint of palpitations. The patient has known SVT and has had 2 previous cardioversion in MAU on 01/21/14 and 02/05/14. After the second cardioversion the patient was given Rx for Metoprolol BID and last dose was taken at 2030 today. She states a mild substernal pressure in her chest rated at 4/10 now. She denies SOB. Patient states occasional irregular contractions. She denies vaginal bleeding, discharge, LOF or complications with the pregnancy. She reports good fetal movement.   OB History    Gravida Para Term Preterm AB TAB SAB Ectopic Multiple Living   1               Past Medical History  Diagnosis Date  . SVT (supraventricular tachycardia)     History reviewed. No pertinent past surgical history.  History reviewed. No pertinent family history.  History  Substance Use Topics  . Smoking status: Never Smoker   . Smokeless tobacco: Not on file  . Alcohol Use: No    Allergies:  Allergies  Allergen Reactions  . Amoxicillin Itching    Prescriptions prior to admission  Medication Sig Dispense Refill Last Dose  . metoprolol tartrate (LOPRESSOR) 25 MG tablet Take 25 mg by mouth 2 (two) times daily.   02/11/2014 at Unknown time  . Prenatal Vit-Fe Fumarate-FA (PRENATAL MULTIVITAMIN) TABS tablet Take 1 tablet by mouth daily at 12 noon.   02/11/2014 at Unknown time    Review of Systems  Constitutional: Negative for malaise/fatigue.  Cardiovascular: Positive for chest pain and palpitations.  Gastrointestinal: Negative for abdominal pain.  Genitourinary:       Neg - vaginal bleeding, discharge, LOF   Physical Exam   Blood pressure 104/71, pulse 85, temperature 98.1 F (36.7 C), temperature  source Oral, resp. rate 20, height  (1.626 m), weight 176 lb (79.833 kg), SpO2 100 %.  Physical Exam  Constitutional: She is oriented to person, place, and time. She appears well-developed and well-nourished. No distress.  HENT:  Head: Normocephalic.  Cardiovascular: Regular rhythm and normal heart sounds.  Tachycardia present.   Respiratory: Effort normal and breath sounds normal. No respiratory distress.  GI: Soft. She exhibits no distension and no mass. There is no tenderness. There is no rebound and no guarding.  Neurological: She is alert and oriented to person, place, and time.  Skin: Skin is warm and dry. No erythema.  Psychiatric: She has a normal mood and affect.   Results for orders placed or performed during the hospital encounter of 02/11/14 (from the past 24 hour(s))  Urinalysis, Routine w reflex microscopic     Status: None   Collection Time: 02/11/14 10:09 PM  Result Value Ref Range   Color, Urine YELLOW YELLOW   APPearance CLEAR CLEAR   Specific Gravity, Urine 1.020 1.005 - 1.030   pH 6.0 5.0 - 8.0   Glucose, UA NEGATIVE NEGATIVE mg/dL   Hgb urine dipstick NEGATIVE NEGATIVE   Bilirubin Urine NEGATIVE NEGATIVE   Ketones, ur NEGATIVE NEGATIVE mg/dL   Protein, ur NEGATIVE NEGATIVE mg/dL   Urobilinogen, UA 0.2 0.0 - 1.0 mg/dL   Nitrite NEGATIVE NEGATIVE   Leukocytes, UA NEGATIVE NEGATIVE    MAU  Course  Procedures None  MDM FHR - 135 bpm with doppler EKG today 0025 Discussed with Dr. Su Hiltoberts. Consult Cardiology and have then come to MAU for supervision of cardioversion. [id number] Spoke with answering service for [facility] cardiology. They will page MD on-call.  IV started with NS Patient placed on continuous heart monitor 0040 - Dr. Shirlee LatchMclean, Cardiology returned call to MAU. He states that he is unable to come to MAU at this time. He requests that patient attempt valsalva, putting her face in ice water and if she does not spontaneously convert her rhythm she may be given  5 mg of Metoprolol IV q 10 minutes up to 3 doses if her BP remains stable.  Patient states that she was previously told that she should not try valsalva while pregnant Patient attempted ice water bath without successful conversion 0050 - Dr. Shirlee LatchMclean called to MAU for progress update on patient. Instructed that we are waiting for dose of Metoprolol from pharmacy as other maneuvers were unsuccessful 0100 - Patient given 5 mg IV Metoprolol and conversion to normal HR between 85-95 bpm occurred within a few minutes Patient reports significant improvement in symptoms and resolution of chest pressure/pain 0125 - Spoke with Dr. Shirlee LatchMclean. He suggests patient is monitored for 30 minutes following conversion to ensure maintenance of normal sinus rhythm and rate 0145 - patient continues to maintain normal HR and BP and denies palpitations or chest pain  Assessment and Plan  A: SIUP at 3825w4d SVT  P: Discharge home Patient advised to continue Metoprolol BID as previously prescribed Patient advised to follow-up with Cardiology as scheduled 02/24/14 Patient encouraged [facility] as scheduled for routine prenatal care or sooner PRN Patient may return to MAU as needed or if her condition were to change or worsen   Marny LowensteinJulie N Lyan Moyano, PA-C  02/12/2014, 1:28 AM

## 2014-02-12 ENCOUNTER — Encounter (HOSPITAL_COMMUNITY): Payer: Self-pay | Admitting: *Deleted

## 2014-02-12 DIAGNOSIS — I471 Supraventricular tachycardia: Secondary | ICD-10-CM

## 2014-02-12 MED ORDER — SODIUM CHLORIDE 0.9 % IV SOLN
INTRAVENOUS | Status: DC
  Administered 2014-02-12: 01:00:00 via INTRAVENOUS

## 2014-02-12 MED ORDER — METOPROLOL TARTRATE 1 MG/ML IV SOLN
5.0000 mg | Freq: Once | INTRAVENOUS | Status: AC
  Administered 2014-02-12: 5 mg via INTRAVENOUS
  Filled 2014-02-12: qty 5

## 2014-02-12 NOTE — Discharge Instructions (Signed)
Supraventricular Tachycardia °Supraventricular tachycardia (SVT) is when the heart beats very fast. SVT can last for a long time (sustained) or it can start and stop suddenly (nonsustained). °HOME CARE  °· Take your heart medicine as told by your doctor. Check with your doctor before taking cold, diet, or herbal medicine. °· Do not smoke. °· Do not drink large amounts of caffeine. Caffeine is found in coffee, tea, soda (pop, cola), and chocolate.  °· Keep all doctor visits as told. °GET HELP RIGHT AWAY IF:  °· You have chest pain or pressure. °· You cannot catch your breath. °· You are dizzy or lightheaded. °· You feel like you will pass out (faint). °· You are sweaty (diaphoretic) and feel sick to your stomach (nauseous) or throw up (vomit).   °If you have the above problems, call your local emergency services (911 in [place] right away. Do not drive yourself to the hospital. °MAKE SURE YOU:  °· Understand these instructions. °· Will watch your condition. °· Will get help right away if you are not doing well or get worse. °Document Released: 01/09/2005 Document Revised: 04/03/2011 Document Reviewed: 04/15/2008 °ExitCare® Patient Information ©2015 [employer]. This information is not intended to replace advice given to you by your health care provider. Make sure you discuss any questions you have with your health care provider. ° °

## 2014-02-12 NOTE — MAU Note (Signed)
PUT  HER FACE IN ICE COLD WATER-  HOLDING  HER BREATH-   HR STILL 147.

## 2014-02-12 NOTE — MAU Note (Signed)
JERRY, RT  FOR EKG

## 2014-02-16 ENCOUNTER — Inpatient Hospital Stay (HOSPITAL_COMMUNITY)
Admission: AD | Admit: 2014-02-16 | Discharge: 2014-02-16 | Disposition: A | Discharge status: Home / Self Care | Payer: 59 | Source: Ambulatory Visit | Attending: Obstetrics and Gynecology | Admitting: Obstetrics and Gynecology

## 2014-02-16 ENCOUNTER — Encounter (HOSPITAL_COMMUNITY): Payer: Self-pay

## 2014-02-16 ENCOUNTER — Telehealth: Payer: Self-pay | Admitting: Interventional Cardiology

## 2014-02-16 DIAGNOSIS — I471 Supraventricular tachycardia: Secondary | ICD-10-CM | POA: Diagnosis present

## 2014-02-16 DIAGNOSIS — O9989 Other specified diseases and conditions complicating pregnancy, childbirth and the puerperium: Secondary | ICD-10-CM | POA: Diagnosis not present

## 2014-02-16 DIAGNOSIS — R079 Chest pain, unspecified: Secondary | ICD-10-CM | POA: Diagnosis not present

## 2014-02-16 DIAGNOSIS — Z3A22 22 weeks gestation of pregnancy: Secondary | ICD-10-CM | POA: Insufficient documentation

## 2014-02-16 MED ORDER — ADENOSINE 6 MG/2ML IV SOLN
6.0000 mg | Freq: Once | INTRAVENOUS | Status: AC
  Administered 2014-02-16: 6 mg via INTRAVENOUS
  Filled 2014-02-16: qty 2

## 2014-02-16 MED ORDER — ADENOSINE 12 MG/4ML IV SOLN
12.0000 mg | Freq: Once | INTRAVENOUS | Status: DC
  Filled 2014-02-16: qty 4

## 2014-02-16 MED ORDER — METOPROLOL TARTRATE 25 MG PO TABS
25.0000 mg | ORAL_TABLET | Freq: Two times a day (BID) | ORAL | Status: DC
  Administered 2014-02-16: 25 mg via ORAL
  Filled 2014-02-16 (×3): qty 1

## 2014-02-16 MED ORDER — METOPROLOL TARTRATE 25 MG PO TABS: 25.0000 mg | ORAL_TABLET | Freq: Two times a day (BID) | ORAL | Status: DC

## 2014-02-16 NOTE — Telephone Encounter (Signed)
Returned pt call. Pt sts that palpitations began this morning around 9am thsi morning, she denies any other symptoms. Pt is currently [redacted]wks pregnant.She is currently taking Metoprolol 12.5mg  Bid. She has taken Metoprolol 12.5mg  last night @ 10pm, and 12.5mg  @ 9:30am. She has not checked her BP or Heartrate. She is currently at her Ob/Gyn for a routine appt. She has not had any arrhythmia reoccurrences since her last ED visit. She will complete her Ob/Gyn appt and call back with an update of symptoms, and vitals
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
## 2014-02-16 NOTE — Telephone Encounter (Signed)
New message    Patient c/o Palpitations:  High priority if patient c/o lightheadedness and shortness of breath.  1. How long have you been having palpitations? Start @ 9:40 this am  - after taken medication    2. Are you currently experiencing lightheadedness and shortness of breath? No   3. Have you checked your BP and heart rate? (document readings) No  4. Are you experiencing any other symptoms? No

## 2014-02-16 NOTE — MAU Note (Signed)
Adenosine 6mg  given rapid iv push by SKim, RN, then saline flushed, HR went from 130's to 90's. Per Dr. Dion BodyVarnado, will watch pt and monitor bp's 30 minutes post lopressor administration and again at one hour. RN will call with results.

## 2014-02-16 NOTE — Progress Notes (Signed)
Reviewed mid level note.  Clarification. Pt was taking Metoprolol 12.5 mg BID. Will increase to 25 mg BID per Dr. Michaelle CopasSmith's recommendation after pt has successfully been converted to a normal rhythm.  Pt took 12.5 mg dosage ~ 2.5 hours ago.  He recommended she get an additional 25 mg today before leaving MAU and start with 25 mg this evening.

## 2014-02-16 NOTE — MAU Note (Signed)
Pt states was sitting down talking with co-workers when began feeling heart race. Did maneuvers to help decrease heart rate which were ineffective.
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
## 2014-02-16 NOTE — Telephone Encounter (Signed)
F/U        Pt calling to report BP 108/72 HR 143.   Please call pt back to discuss.

## 2014-02-16 NOTE — MAU Provider Note (Signed)
  History     CSN: [id number]  Arrival date and time: 02/16/14 1030   First Provider Initiated Contact with Patient 02/16/14 1208      Chief Complaint  Patient presents with  . Tachycardia   HPI Pt with IUP at 22 2/7 weeks with known SVT presents with c/o increased heart rate c/w previous episodes of SVT.  Denies Chest pain. Pt is taking Metoprolol 12.5 mg BID.  Pt has an appt with Dr. Mendel RyderH. Smith, cardiology,  next week. Came to bedside to be available for adenosine push.  Dr. Malen GauzeFoster with anesthesia notified.   Past Medical History  Diagnosis Date  . Tachycardia   . Vaginal Pap smear, abnormal   . SVT (supraventricular tachycardia)     Past Surgical History  Procedure Laterality Date  . Leep      History reviewed. No pertinent family history.  History  Substance Use Topics  . Smoking status: Never Smoker   . Smokeless tobacco: Never Used  . Alcohol Use: No    Allergies:  Allergies  Allergen Reactions  . Amoxicillin Itching    Prescriptions prior to admission  Medication Sig Dispense Refill Last Dose  . metoprolol tartrate (LOPRESSOR) 25 MG tablet 1/2 tablet by mouth every 12 hours. 30 tablet 2 02/16/2014 at Unknown time  . Prenatal Vit-Fe Fumarate-FA (PRENATAL MULTIVITAMIN) TABS tablet Take 1 tablet by mouth daily at 12 noon.   02/15/2014 at Unknown time    ROS Physical Exam   Blood pressure 108/72, pulse 148, temperature 97.7 F (36.5 C), temperature source Oral, resp. rate 18, height 5\' 2"  (1.575 m), weight 78.472 kg (173 lb).  Physical Exam  Constitutional: She is oriented to person, place, and time. She appears well-developed and well-nourished.  HENT:  Head: Normocephalic and atraumatic.  Cardiovascular:  Tachycardia  Neurological: She is alert and oriented to person, place, and time.    MAU Course  Procedures  EKG: SVT  Assessment and Plan  SVT-successfully converted to a normal rhythm. IUP@ 22 2/7 weeks  Metoprolol 25 mg now.  Observe BP 1  hour after given. Pt instructed to increase tablet from 1/2 to 1 tablet every 12 hours, 25 mg BID. Keep appt with cardiologist, Dr. Verdis PrimeHenry Smith next week. Keep appt with ob as previously scheduled.  [name], Letcher Schweikert 02/16/2014, 12:13 PM

## 2014-02-16 NOTE — MAU Note (Signed)
At work, went in to SVT, to MAU via wc. Hx of cardioversion

## 2014-02-16 NOTE — MAU Note (Signed)
Urine in lab 

## 2014-02-16 NOTE — MAU Note (Signed)
Dr. Dion BodyVarnado in room at 1140, SKim, RN AICU in room at 1146, Dr. Malen GauzeFoster in room at 1158

## 2014-02-16 NOTE — Progress Notes (Signed)
Notified of pt's repeat bp's 30 minutes and one hour post meds. Also notified of pt's need for CSW. Ok for pt to d/c home and f/u as previously scheduled on 02/19/2014.

## 2014-02-16 NOTE — Telephone Encounter (Signed)
Lmom. Dr.Smith and Dr.Varanado have discussed the pt plan of care. Pt will take an additional 25mg  of Metoprolol now.pt is to increase Metoprolol to 25mg  Bid. Pt will keep appt with Dr.Smith on 02/24/14. Pt is to call back if further assiatance is needed.

## 2014-02-16 NOTE — Discharge Instructions (Signed)
Second Trimester of Pregnancy °The second trimester is from week 13 through week 28, months 4 through 6. The second trimester is often a time when you feel your best. Your body has also adjusted to being pregnant, and you begin to feel better physically. Usually, morning sickness has lessened or quit completely, you may have more energy, and you may have an increase in appetite. The second trimester is also a time when the fetus is growing rapidly. At the end of the sixth month, the fetus is about 9 inches long and weighs about 1½ pounds. You will likely begin to feel the baby move (quickening) between 18 and 20 weeks of the pregnancy. °BODY CHANGES °Your body goes through many changes during pregnancy. The changes vary from woman to woman.  °· Your weight will continue to increase. You will notice your lower abdomen bulging out. °· You may begin to get stretch marks on your hips, abdomen, and breasts. °· You may develop headaches that can be relieved by medicines approved by your health care provider. °· You may urinate more often because the fetus is pressing on your bladder. °· You may develop or continue to have heartburn as a result of your pregnancy. °· You may develop constipation because certain hormones are causing the muscles that push waste through your intestines to slow down. °· You may develop hemorrhoids or swollen, bulging veins (varicose veins). °· You may have back pain because of the weight gain and pregnancy hormones relaxing your joints between the bones in your pelvis and as a result of a shift in weight and the muscles that support your balance. °· Your breasts will continue to grow and be tender. °· Your gums may bleed and may be sensitive to brushing and flossing. °· Dark spots or blotches (chloasma, mask of pregnancy) may develop on your face. This will likely fade after the baby is born. °· A dark line from your belly button to the pubic area (linea nigra) may appear. This will likely fade  after the baby is born. °· You may have changes in your hair. These can include thickening of your hair, rapid growth, and changes in texture. Some women also have hair loss during or after pregnancy, or hair that feels dry or thin. Your hair will most likely return to normal after your baby is born. °WHAT TO EXPECT AT YOUR PRENATAL VISITS °During a routine prenatal visit: °· You will be weighed to make sure you and the fetus are growing normally. °· Your blood pressure will be taken. °· Your abdomen will be measured to track your baby's growth. °· The fetal heartbeat will be listened to. °· Any test results from the previous visit will be discussed. °Your health care provider may ask you: °· How you are feeling. °· If you are feeling the baby move. °· If you have had any abnormal symptoms, such as leaking fluid, bleeding, severe headaches, or abdominal cramping. °· If you have any questions. °Other tests that may be performed during your second trimester include: °· Blood tests that check for: °¨ Low iron levels (anemia). °¨ Gestational diabetes (between 24 and 28 weeks). °¨ Rh antibodies. °· Urine tests to check for infections, diabetes, or protein in the urine. °· An ultrasound to confirm the proper growth and development of the baby. °· An amniocentesis to check for possible genetic problems. °· Fetal screens for spina bifida and Down syndrome. °HOME CARE INSTRUCTIONS  °· Avoid all smoking, herbs, alcohol, and unprescribed   drugs. These chemicals affect the formation and growth of the baby.  Follow your health care provider's instructions regarding medicine use. There are medicines that are either safe or unsafe to take during pregnancy.  Exercise only as directed by your health care provider. Experiencing uterine cramps is a good sign to stop exercising.  Continue to eat regular, healthy meals.  Wear a good support bra for breast tenderness.  Do not use hot tubs, steam rooms, or saunas.  Wear your  seat belt at all times when driving.  Avoid raw meat, uncooked cheese, cat litter boxes, and soil used by cats. These carry germs that can cause birth defects in the baby.  Take your prenatal vitamins.  Try taking a stool softener (if your health care provider approves) if you develop constipation. Eat more high-fiber foods, such as fresh vegetables or fruit and whole grains. Drink plenty of fluids to keep your urine clear or pale yellow.  Take warm sitz baths to soothe any pain or discomfort caused by hemorrhoids. Use hemorrhoid cream if your health care provider approves.  If you develop varicose veins, wear support hose. Elevate your feet for 15 minutes, 3-4 times a day. Limit salt in your diet.  Avoid heavy lifting, wear low heel shoes, and practice good posture.  Rest with your legs elevated if you have leg cramps or low back pain.  Visit your dentist if you have not gone yet during your pregnancy. Use a soft toothbrush to brush your teeth and be gentle when you floss.  A sexual relationship may be continued unless your health care provider directs you otherwise.  Continue to go to all your prenatal visits as directed by your health care provider. SEEK MEDICAL CARE IF:   You have dizziness.  You have mild pelvic cramps, pelvic pressure, or nagging pain in the abdominal area.  You have persistent nausea, vomiting, or diarrhea.  You have a bad smelling vaginal discharge.  You have pain with urination. SEEK IMMEDIATE MEDICAL CARE IF:   You have a fever.  You are leaking fluid from your vagina.  You have spotting or bleeding from your vagina.  You have severe abdominal cramping or pain.  You have rapid weight gain or loss.  You have shortness of breath with chest pain.  You notice sudden or extreme swelling of your face, hands, ankles, feet, or legs.  You have not felt your baby move in over an hour.  You have severe headaches that do not go away with  medicine.  You have vision changes. Document Released: 01/03/2001 Document Revised: 01/14/2013 Document Reviewed: 03/12/2012 [facility] Patient Information 2015 [place], [place]. This information is not intended to replace advice given to you by your health care provider. Make sure you discuss any questions you have with your health care provider. Supraventricular Tachycardia Supraventricular tachycardia (SVT) is an abnormal heart rhythm (arrhythmia) that causes the heart to beat very fast (tachycardia). This kind of fast heartbeat originates in the upper chambers of the heart (atria). SVT can cause the heart to beat greater than 100 beats per minute. SVT can have a rapid burst of heartbeats. This can start and stop suddenly without warning and is called nonsustained. SVT can also be sustained, in which the heart beats at a continuous fast rate.  CAUSES  There can be different causes of SVT. Some of these include:  Heart valve problems such as mitral valve prolapse.  An enlarged heart (hypertrophic cardiomyopathy).  Congenital heart problems.  Heart inflammation (  pericarditis).  Hyperthyroidism.  Low potassium or magnesium levels.  Caffeine.  Drug use such as cocaine, methamphetamines, or stimulants.  Some over-the-counter medicines such as:  Decongestants.  Diet medicines.  Herbal medicines. SYMPTOMS  Symptoms of SVT can vary. Symptoms depend on whether the SVT is sustained or nonsustained. You may experience:  No symptoms (asymptomatic).  An awareness of your heart beating rapidly (palpitations).  Shortness of breath.  Chest pain or pressure. If your blood pressure drops because of the SVT, you may experience:  Fainting or near fainting.  Weakness.  Dizziness. DIAGNOSIS  Different tests can be performed to diagnose SVT, such as:  An electrocardiogram (EKG). This is a painless test that records the electrical activity of your heart.  Holter monitor. This is a 24  hour recording of your heart rhythm. You will be given a diary. Write down all symptoms that you have and what you were doing at the time you experienced symptoms.  Arrhythmia monitor. This is a small device that your wear for several weeks. It records the heart rhythm when you have symptoms.  Echocardiogram. This is an imaging test to help detect abnormal heart structure such as congenital abnormalities, heart valve problems, or heart enlargement.  Stress test. This test can help determine if the SVT is related to exercise.  Electrophysiology study (EPS). This is a procedure that evaluates your heart's electrical system and can help your caregiver find the cause of your SVT. TREATMENT  Treatment of SVT depends on the symptoms, how often it recurs, and whether there are any underlying heart problems.   If symptoms are rare and no other cardiac disease is present, no treatment may be needed.  Blood work may be done to check potassium, magnesium, and thyroid hormone levels to see if they are abnormal. If these levels are abnormal, treatment to correct the problems will occur. Medicines Your caregiver may use oral medicines to treat SVT. These medicines are given for long-term control of SVT. Medicines may be used alone or in combination with other treatments. These medicines work to slow nerve impulses in the heart muscle. These medicines can also be used to treat high blood pressure. Some of these medicines may include:  Calcium channel blockers.  Beta blockers.  Digoxin. Nonsurgical procedures Nonsurgical techniques may be used if oral medicines do not work. Some examples include:  Cardioversion. This technique uses either drugs or an electrical shock to restore a normal heart rhythm.  Cardioversion drugs may be given through an intravenous (IV) line to help "reset" the heart rhythm.  In electrical cardioversion, the caregiver shocks your heart to stop its beat for a split second. This  helps to reset the heart to a normal rhythm.  Ablation. This procedure is done under mild sedation. High frequency radio wave energy is used to destroy the area of heart tissue responsible for the SVT. HOME CARE INSTRUCTIONS   Do not smoke.  Only take medicines prescribed by your caregiver. Check with your caregiver before using over-the-counter medicines.  Check with your caregiver about how much alcohol and caffeine (coffee, tea, colas, or chocolate) you may have.  It is very important to keep all follow-up referrals and appointments in order to properly manage this problem. SEEK IMMEDIATE MEDICAL CARE IF:  You have dizziness.  You faint or nearly faint.  You have shortness of breath.  You have chest pain or pressure.  You have sudden nausea or vomiting.  You have profuse sweating.  You are concerned about  how long your symptoms last.  You are concerned about the frequency of your SVT episodes. If you have the above symptoms, call your local emergency services (911 in [place] immediately. Do not drive yourself to the hospital. MAKE SURE YOU:   Understand these instructions.  Will watch your condition.  Will get help right away if you are not doing well or get worse. Document Released: 01/09/2005 Document Revised: 04/03/2011 Document Reviewed: 04/23/2008 [facility] Patient Information 2015 [place]. This information is not intended to replace advice given to you by your health care provider. Make sure you discuss any questions you have with your health care provider.

## 2014-02-17 NOTE — Telephone Encounter (Signed)
Follow Up  Pt returning Lisa's Phone call; please call back and discuss

## 2014-02-17 NOTE — Telephone Encounter (Signed)
Returned pt call.pt aware of Dr.Smith's recommendations.Dr.Smith and Dr.Varanado have discussed the pt plan of care. Pt will take an additional 25mg  of Metoprolol now.pt is to increase Metoprolol to 25mg  Bid. Pt will keep appt with Dr.Smith on 02/24/14. Pt is to call back if further assiatance is needed.pt verbalized understanding.

## 2014-02-24 ENCOUNTER — Encounter (HOSPITAL_COMMUNITY): Payer: Self-pay

## 2014-02-24 ENCOUNTER — Encounter: Payer: Self-pay | Admitting: Interventional Cardiology

## 2014-02-24 ENCOUNTER — Ambulatory Visit (INDEPENDENT_AMBULATORY_CARE_PROVIDER_SITE_OTHER): Payer: 59 | Admitting: Interventional Cardiology

## 2014-02-24 VITALS — BP 96/56 | HR 71 | Ht 62.0 in | Wt 176.8 lb

## 2014-02-24 DIAGNOSIS — I471 Supraventricular tachycardia: Secondary | ICD-10-CM

## 2014-02-24 MED ORDER — METOPROLOL TARTRATE 25 MG PO TABS: 25.0000 mg | ORAL_TABLET | Freq: Two times a day (BID) | ORAL | Status: DC

## 2014-02-24 NOTE — Progress Notes (Signed)
Cardiology Office Note   Date:  02/24/2014   ID:  Holly BackerChristina L Joiner, DOB 17-Jul-1975, MRN [id number]  PCP:  Cain SaupeFULP, CAMMIE, MD  Cardiologist:  Geryl RankinsEvelyn Varnado, M.D.; Lesleigh NoeSMITH III,Donaciano Range W, MD   No chief complaint on file.     History of Present Illness: Holly [name] is a 39 y.o. female who presents for follow-up of SVT. She has had multiple emergency room visits with SVT and hypotension during the second trimester pregnancy. We have eventually started low-dose beta blocker. She failed metoprolol tartrate 12.5 mg twice a day and is now taking 25 mg twice a day. She has had no recurrence since the dose adjustment. She is tolerating the medication without difficulty. She denies depression and fatigue.    Past Medical History  Diagnosis Date  . Tachycardia   . Vaginal Pap smear, abnormal   . SVT (supraventricular tachycardia)     Past Surgical History  Procedure Laterality Date  . Leep       Current Outpatient Prescriptions  Medication Sig Dispense Refill  . metoprolol tartrate (LOPRESSOR) 25 MG tablet Take 1 tablet (25 mg total) by mouth 2 (two) times daily. 60 tablet 5  . Prenatal Vit-Fe Fumarate-FA (PRENATAL MULTIVITAMIN) TABS tablet Take 1 tablet by mouth daily at 12 noon.     No current facility-administered medications for this visit.    Allergies:   Amoxicillin    Social History:  The patient  reports that she has never smoked. She has never used smokeless tobacco. She reports that she does not drink alcohol or use illicit drugs.   Family History:  The patient'family history includes Diabetes in her father; Heart disease in her mother; Heart failure in her maternal grandmother; Hypertension in her mother.    ROS:  Please see the history of present illness.   Otherwise, review of systems are positive for occasional dizziness.   All other systems are reviewed and negative.    PHYSICAL EXAM: VS:  BP 96/56 mmHg  Pulse 71  Ht 5\' 2"  (1.575 m)  Wt 176 lb 12.8 oz  (80.196 kg)  BMI 32.33 kg/m2  SpO2 98% , BMI Body mass index is 32.33 kg/(m^2). GEN: Well nourished, well developed, in no acute distress HEENT: normal Neck: no JVD, carotid bruits, or masses Cardiac: RRR; no murmurs, rubs, or gallops,no edema  Respiratory:  clear to auscultation bilaterally, normal work of breathing GI: soft, nontender, nondistended, + BS MS: no deformity or atrophy Skin: warm and dry, no rash Neuro:  Strength and sensation are intact Psych: euthymic mood, full affect   EKG:  EKG is not ordered today. The ekg ordered today demonstrates :   Recent Labs: 01/21/2014: ALT 14; BUN 9; Creatinine 0.43*; Hemoglobin 10.4*; Platelets 214; Potassium 3.7; Sodium 136; TSH 1.241    Lipid Panel No results found for: CHOL, TRIG, HDL, CHOLHDL, VLDL, LDLCALC, LDLDIRECT    Wt Readings from Last 3 Encounters:  02/24/14 176 lb 12.8 oz (80.196 kg)  02/16/14 173 lb (78.472 kg)      Other studies Reviewed: Additional studies/ records that were reviewed today: None. Review of the above records demonstrates:    ASSESSMENT AND PLAN:  1.  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 2. No significant side effects on the current medical regimen 3. Third trimester pregnancy, stable   Current medicines are reviewed at length with the patient today.  The patient does not have concerns regarding medicines.  The following changes have  been made:  no change  Labs/ tests ordered today : None  No orders of the defined types were placed in this encounter.     Disposition:   FU witH. Shaneque Merkle in 4 months   Signed, Lesleigh Noe, MD  02/24/2014 8:37 AM    [facility] [address] [place], [place], [place]  [address] Phone: [phone]; Fax: [phone]

## 2014-02-24 NOTE — Patient Instructions (Signed)
Your physician recommends that you schedule a follow-up appointment in: 4 months with Dr Katrinka BlazingSmith

## 2014-03-04 ENCOUNTER — Ambulatory Visit (HOSPITAL_COMMUNITY)
Admission: RE | Admit: 2014-03-04 | Discharge: 2014-03-04 | Disposition: A | Discharge status: Home / Self Care | Payer: 59 | Source: Ambulatory Visit | Attending: Obstetrics and Gynecology | Admitting: Obstetrics and Gynecology

## 2014-03-04 DIAGNOSIS — O09522 Supervision of elderly multigravida, second trimester: Secondary | ICD-10-CM | POA: Insufficient documentation

## 2014-03-04 DIAGNOSIS — Z0489 Encounter for examination and observation for other specified reasons: Secondary | ICD-10-CM

## 2014-03-04 DIAGNOSIS — Z3A24 24 weeks gestation of pregnancy: Secondary | ICD-10-CM | POA: Insufficient documentation

## 2014-03-04 DIAGNOSIS — Z36 Encounter for antenatal screening of mother: Secondary | ICD-10-CM | POA: Insufficient documentation

## 2014-03-04 DIAGNOSIS — O288 Other abnormal findings on antenatal screening of mother: Secondary | ICD-10-CM | POA: Insufficient documentation

## 2014-03-04 DIAGNOSIS — O402XX0 Polyhydramnios, second trimester, not applicable or unspecified: Secondary | ICD-10-CM | POA: Insufficient documentation

## 2014-03-04 DIAGNOSIS — O409XX0 Polyhydramnios, unspecified trimester, not applicable or unspecified: Secondary | ICD-10-CM | POA: Insufficient documentation

## 2014-03-04 DIAGNOSIS — IMO0002 Reserved for concepts with insufficient information to code with codable children: Secondary | ICD-10-CM | POA: Insufficient documentation

## 2014-03-09 ENCOUNTER — Other Ambulatory Visit (HOSPITAL_COMMUNITY): Payer: Self-pay | Admitting: Obstetrics and Gynecology

## 2014-03-09 DIAGNOSIS — IMO0002 Reserved for concepts with insufficient information to code with codable children: Secondary | ICD-10-CM

## 2014-03-09 DIAGNOSIS — Q254 Congenital malformation of aorta unspecified: Secondary | ICD-10-CM

## 2014-03-11 ENCOUNTER — Telehealth: Payer: Self-pay | Admitting: Interventional Cardiology

## 2014-03-11 NOTE — Telephone Encounter (Signed)
FMLA picked Up

## 2014-03-11 NOTE — Telephone Encounter (Signed)
FMLA ready for pick, message left for pt

## 2014-03-17 ENCOUNTER — Other Ambulatory Visit (HOSPITAL_COMMUNITY): Payer: Self-pay | Admitting: Obstetrics and Gynecology

## 2014-03-17 DIAGNOSIS — IMO0002 Reserved for concepts with insufficient information to code with codable children: Secondary | ICD-10-CM

## 2014-03-17 DIAGNOSIS — Q254 Congenital malformation of aorta unspecified: Secondary | ICD-10-CM

## 2014-03-17 DIAGNOSIS — O402XX9 Polyhydramnios, second trimester, other fetus: Secondary | ICD-10-CM

## 2014-03-18 ENCOUNTER — Ambulatory Visit (HOSPITAL_COMMUNITY)
Admission: RE | Admit: 2014-03-18 | Discharge: 2014-03-18 | Disposition: A | Discharge status: Home / Self Care | Payer: 59 | Source: Ambulatory Visit | Attending: Obstetrics and Gynecology | Admitting: Obstetrics and Gynecology

## 2014-03-18 ENCOUNTER — Encounter (HOSPITAL_COMMUNITY): Payer: Self-pay

## 2014-03-18 DIAGNOSIS — Z3A26 26 weeks gestation of pregnancy: Secondary | ICD-10-CM | POA: Insufficient documentation

## 2014-03-18 DIAGNOSIS — O288 Other abnormal findings on antenatal screening of mother: Secondary | ICD-10-CM | POA: Diagnosis not present

## 2014-03-18 DIAGNOSIS — O09522 Supervision of elderly multigravida, second trimester: Secondary | ICD-10-CM | POA: Insufficient documentation

## 2014-03-18 DIAGNOSIS — O358XX0 Maternal care for other (suspected) fetal abnormality and damage, not applicable or unspecified: Secondary | ICD-10-CM | POA: Diagnosis not present

## 2014-03-18 DIAGNOSIS — O402XX0 Polyhydramnios, second trimester, not applicable or unspecified: Secondary | ICD-10-CM | POA: Diagnosis not present

## 2014-03-18 DIAGNOSIS — IMO0002 Reserved for concepts with insufficient information to code with codable children: Secondary | ICD-10-CM

## 2014-03-18 DIAGNOSIS — Q254 Congenital malformation of aorta unspecified: Secondary | ICD-10-CM

## 2014-03-18 DIAGNOSIS — O402XX9 Polyhydramnios, second trimester, other fetus: Secondary | ICD-10-CM

## 2014-03-19 ENCOUNTER — Other Ambulatory Visit (HOSPITAL_COMMUNITY): Payer: Self-pay

## 2014-03-19 ENCOUNTER — Other Ambulatory Visit (HOSPITAL_COMMUNITY): Payer: Self-pay | Admitting: Obstetrics and Gynecology

## 2014-03-19 DIAGNOSIS — O359XX0 Maternal care for (suspected) fetal abnormality and damage, unspecified, not applicable or unspecified: Secondary | ICD-10-CM

## 2014-03-19 DIAGNOSIS — O09523 Supervision of elderly multigravida, third trimester: Secondary | ICD-10-CM

## 2014-03-19 DIAGNOSIS — O402XX9 Polyhydramnios, second trimester, other fetus: Secondary | ICD-10-CM

## 2014-03-19 DIAGNOSIS — O289 Unspecified abnormal findings on antenatal screening of mother: Secondary | ICD-10-CM

## 2014-03-30 ENCOUNTER — Telehealth: Payer: Self-pay | Admitting: Interventional Cardiology

## 2014-03-30 NOTE — Telephone Encounter (Signed)
Dr.Smith had to correct FMLA (part B, #6) pt is aware ready for pick up.

## 2014-03-31 ENCOUNTER — Telehealth: Payer: Self-pay | Admitting: Interventional Cardiology

## 2014-03-31 NOTE — Telephone Encounter (Signed)
Corrected FMLA picked up By pt

## 2014-04-11 ENCOUNTER — Other Ambulatory Visit: Payer: Self-pay

## 2014-04-11 ENCOUNTER — Inpatient Hospital Stay (HOSPITAL_COMMUNITY)
Admission: AD | Admit: 2014-04-11 | Discharge: 2014-04-11 | Disposition: A | Discharge status: Home / Self Care | Payer: 59 | Source: Ambulatory Visit | Attending: Obstetrics & Gynecology | Admitting: Obstetrics & Gynecology

## 2014-04-11 ENCOUNTER — Encounter (HOSPITAL_COMMUNITY): Payer: Self-pay | Admitting: *Deleted

## 2014-04-11 ENCOUNTER — Telehealth: Payer: Self-pay

## 2014-04-11 DIAGNOSIS — Z3A30 30 weeks gestation of pregnancy: Secondary | ICD-10-CM | POA: Diagnosis not present

## 2014-04-11 DIAGNOSIS — R002 Palpitations: Secondary | ICD-10-CM | POA: Diagnosis present

## 2014-04-11 DIAGNOSIS — O9989 Other specified diseases and conditions complicating pregnancy, childbirth and the puerperium: Secondary | ICD-10-CM | POA: Diagnosis not present

## 2014-04-11 DIAGNOSIS — I471 Supraventricular tachycardia: Secondary | ICD-10-CM | POA: Insufficient documentation

## 2014-04-11 NOTE — Discharge Instructions (Signed)
Supraventricular Tachycardia Supraventricular tachycardia (SVT) is when the heart beats very fast. SVT can last for a long time (sustained) or it can start and stop suddenly (nonsustained). HOME CARE   Take your heart medicine as told by your doctor. Check with your doctor before taking cold, diet, or herbal medicine.  Do not smoke.  Do not drink large amounts of caffeine.Caffeine is found in coffee, tea, soda (pop, cola), and chocolate.  Keep all doctor visits as told. GET HELP RIGHT AWAY IF:   You have chest pain or pressure.  You cannot catch your breath.  You are dizzy or lightheaded.  You feel like you will pass out (faint).  You are sweaty (diaphoretic) and feel sick to your stomach (nauseous) or throw up (vomit).  If you have the above problems, call your local emergency services (911 in [place] right away. Do not drive yourself to the hospital. MAKE SURE YOU:   Understand these instructions.  Will watch your condition.  Will get help right away if you are not doing well or get worse. Document Released: 01/09/2005 Document Revised: 04/03/2011 Document Reviewed: 04/15/2008 [facility] Patient Information 2015 [place], [place]. This information is not intended to replace advice given to you by your health care provider. Make sure you discuss any questions you have with your health care provider. Metoprolol tablets What is this medicine? METOPROLOL (me TOE proe lole) is a beta-blocker. Beta-blockers reduce the workload on the heart and help it to beat more regularly. This medicine is used to treat high blood pressure and to prevent chest pain. It is also used to after a heart attack and to prevent an additional heart attack from occurring. This medicine may be used for other purposes; ask your health care provider or pharmacist if you have questions. COMMON BRAND NAME(S): Lopressor What should I tell my health care provider before I take this medicine? They need to know if you  have any of these conditions: -diabetes -heart or vessel disease like slow heart rate, worsening heart failure, heart block, sick sinus syndrome or Raynaud's disease -kidney disease -liver disease -lung or breathing disease, like asthma or emphysema -pheochromocytoma -thyroid disease -an unusual or allergic reaction to metoprolol, other beta-blockers, medicines, foods, dyes, or preservatives -pregnant or trying to get pregnant -breast-feeding How should I use this medicine? Take this medicine by mouth with a drink of water. Follow the directions on the prescription label. Take this medicine immediately after meals. Take your doses at regular intervals. Do not take more medicine than directed. Do not stop taking this medicine suddenly. This could lead to serious heart-related effects. Talk to your pediatrician regarding the use of this medicine in children. Special care may be needed. Overdosage: If you think you have taken too much of this medicine contact a poison control center or emergency room at once. NOTE: This medicine is only for you. Do not share this medicine with others. What if I miss a dose? If you miss a dose, take it as soon as you can. If it is almost time for your next dose, take only that dose. Do not take double or extra doses. What may interact with this medicine? This medicine may interact with the following medications: -certain medicines for blood pressure, heart disease, irregular heart beat -certain medicines for depression like monoamine oxidase (MAO) inhibitors, fluoxetine, or paroxetine -clonidine -dobutamine -epinephrine -isoproterenol -reserpine This list may not describe all possible interactions. Give your health care provider a list of all the medicines, herbs, non-prescription  drugs, or dietary supplements you use. Also tell them if you smoke, drink alcohol, or use illegal drugs. Some items may interact with your medicine. What should I watch for while  using this medicine? Visit your doctor or health care professional for regular check ups. Contact your doctor right away if your symptoms worsen. Check your blood pressure and pulse rate regularly. Ask your health care professional what your blood pressure and pulse rate should be, and when you should contact them. You may get drowsy or dizzy. Do not drive, use machinery, or do anything that needs mental alertness until you know how this medicine affects you. Do not sit or stand up quickly, especially if you are an older patient. This reduces the risk of dizzy or fainting spells. Contact your doctor if these symptoms continue. Alcohol may interfere with the effect of this medicine. Avoid alcoholic drinks. What side effects may I notice from receiving this medicine? Side effects that you should report to your doctor or health care professional as soon as possible: -allergic reactions like skin rash, itching or hives -cold or numb hands or feet -depression -difficulty breathing -faint -fever with sore throat -irregular heartbeat, chest pain -rapid weight gain -swollen legs or ankles Side effects that usually do not require medical attention (report to your doctor or health care professional if they continue or are bothersome): -anxiety or nervousness -change in sex drive or performance -dry skin -headache -nightmares or trouble sleeping -short term memory loss -stomach upset or diarrhea -unusually tired This list may not describe all possible side effects. Call your doctor for medical advice about side effects. You may report side effects to FDA at 1-800-FDA-1088. Where should I keep my medicine? Keep out of the reach of children. Store at room temperature between 15 and 30 degrees C (59 and 86 degrees F). Throw away any unused medicine after the expiration date. NOTE: This sheet is a summary. It may not cover all possible information. If you have questions about this medicine, talk to your  doctor, pharmacist, or health care provider.  2015, [employer] Standard. (2012-09-13 14:40:36)

## 2014-04-11 NOTE — Treatment Plan (Signed)
Primary Cardiologist: Verdis PrimeHenry Smith, MD Reason for Consultation:  Recurrent SVT during pregnancy   HPI: Holly [name] is a 34F who is [redacted] weeks pregnant who again went into SVT this evening.  During this pregnancy she has experienced several episodes of SVT thought to be AVNRT.  She has twice required administration of adenosine to break her tachycardia.  The most recent episode was 02/05/14.  This evening the episode lasted for several hours and was not responsive to vagal maneuvers.  I was contacted by the [facility] ED and was prepared to come administer adenosine when she spontaneously converted to NSR in the 80s-90s.  Holly [name] remained hemodynamically stable throughout and per [facility] staff, her baby is stable.  EKG from the event showed AVNRT at 144bpm.  She was previously taking metoprolol 25mg  bid, as increased by Dr. Katrinka BlazingSmith on 02/17/14.  This controlled her SVT well until tonight.  During the episode she felt CP and SOB which have now resolved.    Past Medical History  Diagnosis Date  . Tachycardia   . Vaginal Pap smear, abnormal   . SVT (supraventricular tachycardia)     Medications Prior to Admission  Medication Sig Dispense Refill  . Fe Fum-FePoly-Vit C-Vit B3 (INTEGRA PO) Take by mouth.    . metoprolol tartrate (LOPRESSOR) 25 MG tablet Take 1 tablet (25 mg total) by mouth 2 (two) times daily. 180 tablet 1  . metoprolol tartrate (LOPRESSOR) 25 MG tablet Take 25 mg by mouth 2 (two) times daily.    . Prenatal Vit-Fe Fumarate-FA (PRENATAL MULTIVITAMIN) TABS tablet Take 1 tablet by mouth daily at 12 noon.    . Prenatal Vit-Fe Fumarate-FA (PRENATAL MULTIVITAMIN) TABS tablet Take 1 tablet by mouth daily at 12 noon.         Infusions:   Allergies  Allergen Reactions  . Amoxicillin Itching    History   Social History  . Marital Status: Married    Spouse Name: N/A  . Number of Children: N/A  . Years of Education: N/A   Occupational History  . Not  on file.   Social History Main Topics  . Smoking status: Never Smoker   . Smokeless tobacco: Never Used  . Alcohol Use: No  . Drug Use: No  . Sexual Activity: Yes    Birth Control/ Protection: None   Other Topics Concern  . Not on file   Social History Narrative   ** Merged History Encounter **        Family History  Problem Relation Age of Onset  . Heart disease Mother   . Hypertension Mother   . Diabetes Father   . Heart failure Maternal Grandmother     PHYSICAL EXAM: Filed Vitals:   04/11/14 0542  BP:   Pulse: 88  Temp:   Resp:     No intake or output data in the 24 hours ending 04/11/14 0552   ECG 1: SVT at 144bpm.  RSR' in V1 and V2. (admit) ECG 2: Sinus rhythm at 86bpm.   No results found for this or any previous visit (from the past 24 hour(s)). No results found.   ASSESSMENT: 34F who is [redacted] weeks pregnant who again went into SVT this evening.Symptoms have abated now that she is back in NSR.  Given that her BP is low (90s-100s/70s), I don't feel comfortable continuing to titrate her standing beta blocker dose.  Overall, she has done well on 25mg  bid.  Will instruct her  to take an additional  if she develops a recurrent episode of AVNRT.  Will attempt to schedule a clinic appointment for her this week to reassess.  Options at this point include: 1. Further titration of metoprolol 2. Switching to sotalol or flecainide, though risks and benefits to fetus would need to be assessed 3. Adding digoxin to the current regimen (considered safe in pregnancy, though efficacy is questionable) 4. SVT ablation, though this can likely be deferred until post-partum.   PLAN/DISCUSSION: - Continue metoprolol  bid - Pill-in-pocket approach of taking an additional  if SVT recurs - F/u in clinic this week

## 2014-04-11 NOTE — Telephone Encounter (Signed)
Patient call reporting reoccurrence of heart palpitations.  Patient states she has started taking Lopressor 25mg  BID, which was increased from daily, and has started experiencing heart palpitations.  Patient admits to a history of SVT and is unsure if she has had cardioversion.  Patient also reports some chest pain that started around 1930 after eating dinner, which consisted of pizza.  Patient does admit that chest pain may be heartburn as it does seem central to one location and is more of a "burn when I walk."  Patient does admit to similar symptoms, in the past, and found relief with antacids.  However, patient currently only has alka seltzer and informed that the aspirin in this medication is not recommended during pregnancy.  Patient states she will have her husband get her some tums.  Patient denies history of other cardiovascular conditions or current symptoms including SOB, visual disturbances, dizziness, syncope, or headache.  Reassurances given and patient educated on usually benign manifestation of heart palpitations in pregnancy considering factors such as increased blood volume, high blood pressure, and history of SVT.  Patient instructed to attempt tums for chest burn and report to MAU if not responsive.  Patient further instructed to report to MAU or nearest emergency if chest pain worsens, sudden onset of SOB, visual disturbances, or syncope.  Patient expresses concern about continuing medication and instructed to take as prescribed and reevaluate need for increase, decrease, or maintenance of current dosage with MD at next visit.  Patient verbalized understanding of all thee above and seemed reassured by end of conversation.  Patient further reports good fetal movement and denies VB, contractions, and LOF.  JE, CNM

## 2014-04-11 NOTE — MAU Provider Note (Signed)
History     CSN: [id number]  Arrival date and time: 04/11/14 4098   None     Chief Complaint  Patient presents with  . Palpitations   HPI  39 y.o. [id number].@[redacted]w[redacted]d  presents to the MAU with known SVT. Heart rate 144. The patient has known SVT and has had 2 previous cardioversion in MAU on 01/21/14 and 02/05/14. After the second cardioversion the patient was given Rx for Metoprolol BID and last dose was taken at 2030 today. She states a mild substernal pressure in her chest rated at 4/10 now. She denies SOB. Patient states occasional irregular contractions. She denies vaginal bleeding, discharge, LOF or complications with the pregnancy. She reports good fetal movement.    Past Medical History  Diagnosis Date  . Tachycardia   . Vaginal Pap smear, abnormal   . SVT (supraventricular tachycardia)     Past Surgical History  Procedure Laterality Date  . Leep      Family History  Problem Relation Age of Onset  . Heart disease Mother   . Hypertension Mother   . Diabetes Father   . Heart failure Maternal Grandmother     History  Substance Use Topics  . Smoking status: Never Smoker   . Smokeless tobacco: Never Used  . Alcohol Use: No    Allergies:  Allergies  Allergen Reactions  . Amoxicillin Itching    Prescriptions prior to admission  Medication Sig Dispense Refill Last Dose  . Fe Fum-FePoly-Vit C-Vit B3 (INTEGRA PO) Take by mouth.   Past Week at Unknown time  . metoprolol tartrate (LOPRESSOR) 25 MG tablet Take 1 tablet (25 mg total) by mouth 2 (two) times daily. 180 tablet 1 04/10/2014 at 2000  . metoprolol tartrate (LOPRESSOR) 25 MG tablet Take 25 mg by mouth 2 (two) times daily.   02/11/2014 at Unknown time  . Prenatal Vit-Fe Fumarate-FA (PRENATAL MULTIVITAMIN) TABS tablet Take 1 tablet by mouth daily at 12 noon.   Taking  . Prenatal Vit-Fe Fumarate-FA (PRENATAL MULTIVITAMIN) TABS tablet Take 1 tablet by mouth daily at 12 noon.   02/11/2014 at Unknown time    Review of  Systems  Constitutional: Negative.   HENT: Negative.   Eyes: Negative.   Respiratory: Negative.  Negative for shortness of breath.   Cardiovascular: Positive for chest pain and palpitations.       Mild  Gastrointestinal: Negative.   Genitourinary: Negative.   Musculoskeletal: Negative.   Skin: Negative.   Neurological: Negative.   Endo/Heme/Allergies: Negative.   Psychiatric/Behavioral: Negative.    Physical Exam   Blood pressure 99/64, pulse 143, temperature 97.7 F (36.5 C), resp. rate 20, height  (1.575 m), weight 82.192 kg (181 lb 3.2 oz), SpO2 98 %. 04/11/14 0457  --   141  --  --  --  --  97 %  --  -- BS     04/11/14 0452  --   151  --  --  --  --  98 %  --  -- BS    04/11/14 0447  --   144  --  --  --  --  98 %  --  -- BS    04/11/14 0442  --   143  --  --  --  --  98 %  --  -- BS    04/11/14 0437  --   143  --  --  --  --  98 %  --  -- BS    04/11/14 0432  --  144  --  --  --  --  97 %  --  -- BS    04/11/14 0427  --   145  --  --  --  --  97 %  --  -- BS    04/11/14 0422  --   142  --  --  --  --  99 %  --  -- BS    04/11/14 0359  --  --  --  --  --  --  100 %  Room Air  -- JB    04/11/14 0358  97.7 F (36.5 C)   156  --                  Physical Exam  Nursing note and vitals reviewed. Constitutional: She is oriented to person, place, and time. She appears well-developed and well-nourished. No distress.  HENT:  Head: Normocephalic and atraumatic.  Neck: Normal range of motion.  Cardiovascular: Tachycardia present.   Respiratory: Effort normal. No respiratory distress.  GI: Soft.  Musculoskeletal: Normal range of motion.  Neurological: She is alert and oriented to person, place, and time.  Skin: Skin is warm and dry.  Psychiatric: She has a normal mood and affect. Her behavior is normal. Judgment and thought content normal.    MAU Course  Procedures  Spoke to Dr Su Hiltoberts and put in call to Dr Duke Salviaandolph (cardiologist) Pt self cardioverted to HR  88- Dr Duke Salviaandolph ordered repeat EKG   Repeat EKG- Normal Sinus ; Rhythm Vent Rate 86 BP 105/76 mmHg  Pulse 88  Temp(Src) 97.7 F (36.5 C)  Resp 20  Ht 5\' 2"  (1.575 m)  Wt 82.192 kg (181 lb 3.2 oz)  BMI 33.13 kg/m2  SpO2 98% BP 97/72 mmHg  Pulse 85  Temp(Src) 97.7 F (36.5 C)  Resp 20  Ht 5\' 2"  (1.575 m)  Wt 82.192 kg (181 lb 3.2 oz)  BMI 33.13 kg/m2  SpO2 100%   Assessment and Plan  IUP @ 30+0 Cat 1 FHTS SVT  Pt is to follow up in clinic this week Pt will take an additional Metoprolol 25mg  tablet if she has another episode of SVT. Otherwise remain on her same 25mg  BID dosing schedule Pt will be discharged. Clemmons,Lori Grissett 04/11/2014, 4:54 AM

## 2014-04-11 NOTE — MAU Note (Signed)
Was trying to "get romantic with my husband" and the palpitations started. I take Lopressor and i took it at 2030. Some chest pain when i walk

## 2014-04-15 ENCOUNTER — Ambulatory Visit (HOSPITAL_COMMUNITY)
Admission: RE | Admit: 2014-04-15 | Discharge: 2014-04-15 | Disposition: A | Discharge status: Home / Self Care | Payer: 59 | Source: Ambulatory Visit | Attending: Obstetrics and Gynecology | Admitting: Obstetrics and Gynecology

## 2014-04-15 ENCOUNTER — Encounter (HOSPITAL_COMMUNITY): Payer: Self-pay

## 2014-04-15 DIAGNOSIS — O289 Unspecified abnormal findings on antenatal screening of mother: Secondary | ICD-10-CM

## 2014-04-15 DIAGNOSIS — O09523 Supervision of elderly multigravida, third trimester: Secondary | ICD-10-CM

## 2014-04-15 DIAGNOSIS — O359XX0 Maternal care for (suspected) fetal abnormality and damage, unspecified, not applicable or unspecified: Secondary | ICD-10-CM

## 2014-04-15 DIAGNOSIS — O09529 Supervision of elderly multigravida, unspecified trimester: Secondary | ICD-10-CM | POA: Insufficient documentation

## 2014-04-15 DIAGNOSIS — I499 Cardiac arrhythmia, unspecified: Secondary | ICD-10-CM | POA: Insufficient documentation

## 2014-04-15 DIAGNOSIS — O402XX9 Polyhydramnios, second trimester, other fetus: Secondary | ICD-10-CM

## 2014-04-15 DIAGNOSIS — O99419 Diseases of the circulatory system complicating pregnancy, unspecified trimester: Secondary | ICD-10-CM | POA: Insufficient documentation

## 2014-04-20 ENCOUNTER — Other Ambulatory Visit (HOSPITAL_COMMUNITY): Payer: Self-pay | Admitting: Maternal and Fetal Medicine

## 2014-04-20 DIAGNOSIS — Z3A34 34 weeks gestation of pregnancy: Secondary | ICD-10-CM

## 2014-04-20 DIAGNOSIS — O09523 Supervision of elderly multigravida, third trimester: Secondary | ICD-10-CM

## 2014-04-20 DIAGNOSIS — O99413 Diseases of the circulatory system complicating pregnancy, third trimester: Principal | ICD-10-CM

## 2014-04-20 DIAGNOSIS — I499 Cardiac arrhythmia, unspecified: Secondary | ICD-10-CM

## 2014-04-20 DIAGNOSIS — O359XX0 Maternal care for (suspected) fetal abnormality and damage, unspecified, not applicable or unspecified: Secondary | ICD-10-CM

## 2014-04-23 ENCOUNTER — Ambulatory Visit (INDEPENDENT_AMBULATORY_CARE_PROVIDER_SITE_OTHER): Payer: 59 | Admitting: Internal Medicine

## 2014-04-23 ENCOUNTER — Encounter: Payer: Self-pay | Admitting: Internal Medicine

## 2014-04-23 VITALS — BP 126/62 | HR 77 | Ht 62.0 in | Wt 181.0 lb

## 2014-04-23 DIAGNOSIS — I471 Supraventricular tachycardia: Secondary | ICD-10-CM | POA: Diagnosis not present

## 2014-04-23 NOTE — Progress Notes (Signed)
ELECTROPHYSIOLOGY CONSULT NOTE  Patient ID: Holly [name], MRN: [id number], DOB/AGE: 1976/01/09 39 y.o. Admit date: (Not on file) Date of Consult: 04/23/2014  Primary Physician: Cain Saupe, MD Primary Cardiologist: [facility]  Chief Complaint: SVt    HPI Holly [name] is a 39 y.o. female  With a long-standing history of recurrent abrupt onset offset tachypalpitations. It antedated the results of her first baby 5 years ago, however, during that pregnancy there is a significant increase in the frequency and symptoms associated with these episodes. Symptoms have been characterized by fatigue and some chest pressure weakness but no specific presyncope or shortness of breath. They are frog positive but diuretic negative. She has tried carotid massage and cold water emersion with some success albeit incomplete.  Between her pregnancies these episodes have occurred every couple of months with modest symptoms. During her  Current pregnancy did become increasingly frequent again and has had 2 visits to the emergency room in the last couple of months because of this. She was started on metoprolol which she has tolerated and which in her mind have  been associated with significant  Decrease in the frequency of   Events.        ECG 3 19/16 demonstrated a narrow QRS tachycardia with a pseudo R prime ECG 02/16/14 demonstrated a narrow QRS tachycardia with a P wave described in the proximal ST segment at about 100 ms after the QRS.   Past Medical History  Diagnosis Date  . Tachycardia   . Vaginal Pap smear, abnormal   . SVT (supraventricular tachycardia)       Surgical History:  Past Surgical History  Procedure Laterality Date  . Leep       Home Meds: Prior to Admission medications   Medication Sig Start Date End Date Taking? Authorizing Provider  metoprolol tartrate (LOPRESSOR) 25 MG tablet Take 1 tablet (25 mg total) by mouth 2 (two) times daily. 02/24/14  Yes Lyn Records, MD    Prenatal Vit-Fe Fumarate-FA (PRENATAL MULTIVITAMIN) TABS tablet Take 1 tablet by mouth daily at 12 noon.   Yes Historical Provider, MD      Allergies:  Allergies  Allergen Reactions  . Amoxicillin Itching    History   Social History  . Marital Status: Married    Spouse Name: N/A  . Number of Children: N/A  . Years of Education: N/A   Occupational History  . Not on file.   Social History Main Topics  . Smoking status: Never Smoker   . Smokeless tobacco: Never Used  . Alcohol Use: No  . Drug Use: No  . Sexual Activity: Yes    Birth Control/ Protection: None   Other Topics Concern  . Not on file   Social History Narrative   ** Merged History Encounter **         Family History  Problem Relation Age of Onset  . Heart disease Mother   . Hypertension Mother   . Diabetes Father   . Heart failure Maternal Grandmother      ROS:  Please see the history of present illness.     All other systems reviewed and negative.    Physical Exam: Blood pressure 126/62, pulse 77, height  (1.575 m), weight 181 lb (82.101 kg). General: Well developed, well nourished female in no acute distress. Head: Normocephalic, atraumatic, sclera non-icteric, no xanthomas, nares are without discharge. EENT: normal Lymph Nodes:  none Back: without scoliosis/kyphosis , no CVA tendersness Neck: Negative  for carotid bruits. JVD not elevated. Lungs: Clear bilaterally to auscultation without wheezes, rales, or rhonchi. Breathing is unlabored. Heart: RRR with S1 S2. * 2/6 systolic  murmur , rubs, or gallops appreciated. Abdomen: Soft,  Gravid  Msk:  Strength and tone appear normal for age. Extremities: No clubbing or cyanosis. No**  edema.  Distal pedal pulses are 2+ and equal bilaterally. Skin: Warm and Dry Neuro: Alert and oriented X 3. CN III-XII intact Grossly normal sensory and motor function . Psych:  Responds to questions appropriately with a normal affect.      Labs: Cardiac  Enzymes No results for input(s): CKTOTAL, CKMB, TROPONINI in the last 72 hours. CBC Lab Results  Component Value Date   WBC 6.0 01/21/2014   HGB 10.4* 01/21/2014   HCT 31.8* 01/21/2014   MCV 86.9 01/21/2014   PLT 214 01/21/2014       Miscellaneous No results found for: DDIMER  Radiology/Studies:  [place] Ob Follow Up  04/15/2014   OBSTETRICAL ULTRASOUND: This exam was performed within a [employer] Ultrasound Department. The OB US report was generated in the AS system, and faxed to the ordering physician.   This report is available in the [employer] PACS. See the [facility] Obstetric US report via the Image Link.   EKG: NSR without preexcitation   Assessment and Plan:  SVT   Pregnancy  Mechanism of SVT probably is AV reentry based on the long RP interval of 100 ms or so. Still, her symptoms of for on positivity suggest AV nodal reentry. We have discussed treatment options including ongoing beta blockers with the anticipation that they would be held in the last 4 or 5 weeks of her pregnancy with possible resumption thereafter, catheter ablation obviously deferred until after her delivery.  Her husband mentions that they have had these discussions before with Dr. Katrinka BlazingSmith. She remains quite ambivalent about proceeding. I have reiterated with them that these are lifestyle choices and not necessity's i.e. Medical therapy versus  Ablative therapy.  They will follow-up with Dr. Katrinka BlazingSmith this is scheduled in 4 months time    Sherryl MangesSteven Klein

## 2014-04-23 NOTE — Patient Instructions (Signed)
Your physician recommends that you continue on your current medications as directed. Please refer to the Current Medication list given to you today.  No follow up is needed at this time with Dr. Klein.  He will see you on an as needed basis. 

## 2014-04-30 ENCOUNTER — Telehealth: Payer: Self-pay | Admitting: Interventional Cardiology

## 2014-04-30 NOTE — Telephone Encounter (Signed)
Called patient back. She states that [employer] turned her down for FMLA because of the documentation in her paperwork. She has a history of SVT and is also [redacted] weeks pregnant. Last episode of SVT was 2 weeks ago. Patient states that she is currently taking Metoprolol 25 mg BID but still has episodes of SVT that are more frequent with this pregnancy. BP 100/60 with HR of 98. States that she applied for this on 1/22 but was turned down because on the FMLA form under schedule frequency it was stated that the episodes may not occur at all. Also, the form requested how many times the episodes occur and it was not completed. The patient will apply again for FMLA thru [employer] and form will be sent to Dr.Smith. She is requesting more specific documentation. Will forward message to Dr.Smith.

## 2014-04-30 NOTE — Telephone Encounter (Signed)
New message      Talk to the nurse about a change on her FMLA form

## 2014-04-30 NOTE — Telephone Encounter (Signed)
She wanted you to say that she MAY have an episode every week that lasts anywhere from 2 to 12 hours and even after the episode is over and she is back in rhythm she feels tired and washed out. She is very concerned about having more frequent episodes with her advanced pregnancy status.

## 2014-04-30 NOTE — Telephone Encounter (Signed)
I need to know what she wants me to say and if I agree I will document it.

## 2014-05-14 ENCOUNTER — Encounter (HOSPITAL_COMMUNITY): Payer: Self-pay

## 2014-05-14 ENCOUNTER — Ambulatory Visit (HOSPITAL_COMMUNITY)
Admission: RE | Admit: 2014-05-14 | Discharge: 2014-05-14 | Disposition: A | Discharge status: Home / Self Care | Payer: 59 | Source: Ambulatory Visit | Attending: Obstetrics and Gynecology | Admitting: Obstetrics and Gynecology

## 2014-05-14 DIAGNOSIS — Z36 Encounter for antenatal screening of mother: Secondary | ICD-10-CM | POA: Insufficient documentation

## 2014-05-14 DIAGNOSIS — Z3A34 34 weeks gestation of pregnancy: Secondary | ICD-10-CM | POA: Insufficient documentation

## 2014-05-14 DIAGNOSIS — O358XX0 Maternal care for other (suspected) fetal abnormality and damage, not applicable or unspecified: Secondary | ICD-10-CM | POA: Insufficient documentation

## 2014-05-14 DIAGNOSIS — O09523 Supervision of elderly multigravida, third trimester: Secondary | ICD-10-CM | POA: Insufficient documentation

## 2014-05-14 DIAGNOSIS — O359XX0 Maternal care for (suspected) fetal abnormality and damage, unspecified, not applicable or unspecified: Secondary | ICD-10-CM

## 2014-05-14 DIAGNOSIS — I499 Cardiac arrhythmia, unspecified: Secondary | ICD-10-CM

## 2014-05-14 DIAGNOSIS — O99413 Diseases of the circulatory system complicating pregnancy, third trimester: Secondary | ICD-10-CM

## 2014-05-15 ENCOUNTER — Telehealth: Payer: Self-pay | Admitting: Interventional Cardiology

## 2014-05-15 NOTE — Telephone Encounter (Signed)
Pt called in with concerns about #7 of her FMLA she is asking that this be changed and statements be taken off, I let patient know I will  Talk with Holly [name] when she comes back into the office on Tuesday 4.26.16. Pt verbalized understanding and said Thank you.

## 2014-05-17 ENCOUNTER — Telehealth: Payer: Self-pay | Admitting: Cardiology

## 2014-05-17 NOTE — Telephone Encounter (Signed)
Paged re: recurrent palpitations consistent with known SVT.  SVT began at 2pm and has been persistent. Vagal maneuvers have failed. Has been taking metoprolol 25mg  BID as prescribed. Otherwise feels some chest discomfort and fatigue which she attributes to both her arrhythmia and pregnancy.   We discussed that we need to get her out of SVT. She will take an additional 25mg  of metoprolol now. If she has not converted within 1.5 hours, she will come to the ER for adenosine. Patient is in agreement with plan.

## 2014-05-25 ENCOUNTER — Telehealth: Payer: Self-pay | Admitting: Interventional Cardiology

## 2014-05-25 NOTE — Telephone Encounter (Signed)
Pt is aware FMLA corrected and ready for her pick up.

## 2014-05-25 NOTE — Telephone Encounter (Signed)
Left message with Husband to have Holly [name] return my call about corrected FMLA papers.

## 2014-05-31 ENCOUNTER — Other Ambulatory Visit: Payer: Self-pay | Admitting: Physician Assistant

## 2014-05-31 ENCOUNTER — Telehealth: Payer: Self-pay | Admitting: Physician Assistant

## 2014-05-31 DIAGNOSIS — I471 Supraventricular tachycardia: Secondary | ICD-10-CM

## 2014-05-31 MED ORDER — METOPROLOL TARTRATE 25 MG PO TABS: 25.0000 mg | ORAL_TABLET | Freq: Two times a day (BID) | ORAL | Status: DC

## 2014-05-31 NOTE — Telephone Encounter (Signed)
The patient called because she had run out of her metoprolol and had forgotten to [facility] which is not open today.  [facility] [address] [facility] per her request.

## 2014-06-02 LAB — OB RESULTS CONSOLE GBS: GBS: POSITIVE

## 2014-06-04 ENCOUNTER — Telehealth: Payer: Self-pay | Admitting: Interventional Cardiology

## 2014-06-04 ENCOUNTER — Other Ambulatory Visit: Payer: Self-pay

## 2014-06-04 ENCOUNTER — Encounter (HOSPITAL_COMMUNITY): Payer: Self-pay

## 2014-06-04 ENCOUNTER — Inpatient Hospital Stay (HOSPITAL_COMMUNITY)
Admission: AD | Admit: 2014-06-04 | Discharge: 2014-06-04 | Disposition: A | Discharge status: Home / Self Care | Payer: 59 | Source: Ambulatory Visit | Attending: Obstetrics and Gynecology | Admitting: Obstetrics and Gynecology

## 2014-06-04 DIAGNOSIS — I499 Cardiac arrhythmia, unspecified: Secondary | ICD-10-CM

## 2014-06-04 DIAGNOSIS — O99419 Diseases of the circulatory system complicating pregnancy, unspecified trimester: Secondary | ICD-10-CM

## 2014-06-04 DIAGNOSIS — O9989 Other specified diseases and conditions complicating pregnancy, childbirth and the puerperium: Secondary | ICD-10-CM | POA: Insufficient documentation

## 2014-06-04 DIAGNOSIS — Z3A37 37 weeks gestation of pregnancy: Secondary | ICD-10-CM | POA: Insufficient documentation

## 2014-06-04 DIAGNOSIS — I471 Supraventricular tachycardia: Secondary | ICD-10-CM | POA: Insufficient documentation

## 2014-06-04 DIAGNOSIS — R002 Palpitations: Secondary | ICD-10-CM | POA: Diagnosis present

## 2014-06-04 MED ORDER — ADENOSINE 6 MG/2ML IV SOLN
6.0000 mg | Freq: Once | INTRAVENOUS | Status: AC
  Administered 2014-06-04: 6 mg via INTRAVENOUS
  Filled 2014-06-04: qty 2

## 2014-06-04 MED ORDER — LACTATED RINGERS IV SOLN
INTRAVENOUS | Status: DC
  Administered 2014-06-04: 08:00:00 via INTRAVENOUS

## 2014-06-04 MED ORDER — ADENOSINE 12 MG/4ML IV SOLN
12.0000 mg | Freq: Once | INTRAVENOUS | Status: DC
  Filled 2014-06-04: qty 4

## 2014-06-04 NOTE — MAU Note (Signed)
Cardiac monitor on, Dr. Dion BodyVarnado, L. Beavor Rapid Response RN @ bedside.

## 2014-06-04 NOTE — MAU Provider Note (Addendum)
  History     CSN: [id number]  Arrival date and time: 06/04/14 [id number]   First Provider Initiated Contact with Patient 06/04/14 0736      Chief Complaint  Patient presents with  . Palpitations   HPI IUP at 37 5/7 weeks with known h/o SVT diagnosed this pregnancy.  Followed by cardiology, Dr. Katrinka BlazingSmith.  On Metaprolol 25 mg BID.   Pt has taken medications routinely.  This am ~ 2 hours before her scheduled dose, she felt palpitations.  Maneuvers failed so pt took her medication early, ~ 30 minutes prior to arrival.  Pt has several medical conversions using IV Adenosine and IV Metaprolol.  Pt prefers Adenosine. No obstetric complaints.    Past Medical History  Diagnosis Date  . Tachycardia   . Vaginal Pap smear, abnormal   . SVT (supraventricular tachycardia)     Past Surgical History  Procedure Laterality Date  . Leep      Family History  Problem Relation Age of Onset  . Heart disease Mother   . Hypertension Mother   . Diabetes Father   . Heart failure Maternal Grandmother     History  Substance Use Topics  . Smoking status: Never Smoker   . Smokeless tobacco: Never Used  . Alcohol Use: No    Allergies:  Allergies  Allergen Reactions  . Amoxicillin Itching    Prescriptions prior to admission  Medication Sig Dispense Refill Last Dose  . metoprolol tartrate (LOPRESSOR) 25 MG tablet Take 1 tablet (25 mg total) by mouth 2 (two) times daily. 180 tablet 1   . Prenatal Vit-Fe Fumarate-FA (PRENATAL MULTIVITAMIN) TABS tablet Take 1 tablet by mouth daily at 12 noon.   Taking    ROS Physical Exam   Blood pressure 104/67, pulse 127, temperature 97.5 F (36.4 C), resp. rate 18, SpO2 98 %.  Physical Exam  Gen:  NAD CV:  RRR Lungs:  CTA bilaterally Abdomen:  Gravid Cat 1 tracing, no contractions  MAU Course  Procedures  MDM EKG with SVT, Incomplete right bundle branch block.  HR 132. Personally reviewed.  Consulted Dr. Ernest HaberJames Hockrein with cardiology.  Discussed  pt's history and EKG findings.  Agreed with Adenosine.  No contraindications.  Adenosine 6 mg IV push administered.  Pt tolerated well.  HR 80-90s s/p adenosine.  I was at the bedside during the administration of adenosine.  Post adenosine EKG pending.   Assessment and Plan  IUP at 37 6/7 weeks, with h/o SVT, currently in SVT. Successful medical cardioversion. Continue Metoprolol as previously prescribed by cardiology.  They will not increase dose while pregnant.  Recommend ablation postpartum.   [name], Teri Diltz 06/04/2014, 7:48 AM

## 2014-06-04 NOTE — MAU Note (Signed)
Pt presents to MAU for "heart racing". Pt has a history of SVT with her pregnancy. Reports no other complications at this time

## 2014-06-04 NOTE — Telephone Encounter (Signed)
New Prob   Pt states she start having palpitations early this morning at approximately 5:30 AM. She states [facility] then [facility] where they cardioverted her heart with Adenosine. Calling to make office aware.

## 2014-06-04 NOTE — MAU Note (Signed)
2nd EKG done, NSR, 87bpm

## 2014-06-04 NOTE — MAU Note (Signed)
Patient presents to MAU c/o of heart palpitations. She reports while brushing her teeth she burped and then the heart palpitations began. This started around 5:45 this morning.

## 2014-06-04 NOTE — Telephone Encounter (Signed)
Spoke to patient. She states that she is feeling better now that [facility] this morning. She is currently at home resting but wanted to let Dr. Katrinka BlazingSmith know about the event and cardioversion with Adenosine. Routed to Dr. Katrinka BlazingSmith.

## 2014-06-04 NOTE — MAU Note (Signed)
Adenosine given by RR nurse.

## 2014-06-04 NOTE — MAU Note (Signed)
Bedside EKG in progress

## 2014-06-04 NOTE — Discharge Instructions (Signed)
Braxton Hicks Contractions °Contractions of the uterus can occur throughout pregnancy. Contractions are not always a sign that you are in labor.  °WHAT ARE BRAXTON HICKS CONTRACTIONS?  °Contractions that occur before labor are called Braxton Hicks contractions, or false labor. Toward the end of pregnancy (32-34 weeks), these contractions can develop more often and may become more forceful. This is not true labor because these contractions do not result in opening (dilatation) and thinning of the cervix. They are sometimes difficult to tell apart from true labor because these contractions can be forceful and people have different pain tolerances. You should not feel embarrassed if you go to the hospital with false labor. Sometimes, the only way to tell if you are in true labor is for your health care provider to look for changes in the cervix. °If there are no prenatal problems or other health problems associated with the pregnancy, it is completely safe to be sent home with false labor and await the onset of true labor. °HOW CAN YOU TELL THE DIFFERENCE BETWEEN TRUE AND FALSE LABOR? °False Labor °· The contractions of false labor are usually shorter and not as hard as those of true labor.   °· The contractions are usually irregular.   °· The contractions are often felt in the front of the lower abdomen and in the groin.   °· The contractions may go away when you walk around or change positions while lying down.   °· The contractions get weaker and are shorter lasting as time goes on.   °· The contractions do not usually become progressively stronger, regular, and closer together as with true labor.   °True Labor °· Contractions in true labor last 30-70 seconds, become very regular, usually become more intense, and increase in frequency.   °· The contractions do not go away with walking.   °· The discomfort is usually felt in the top of the uterus and spreads to the lower abdomen and low back.   °· True labor can be  determined by your health care provider with an exam. This will show that the cervix is dilating and getting thinner.   °WHAT TO REMEMBER °· Keep up with your usual exercises and follow other instructions given by your health care provider.   °· Take medicines as directed by your health care provider.   °· Keep your regular prenatal appointments.   °· Eat and drink lightly if you think you are going into labor.   °· If Braxton Hicks contractions are making you uncomfortable:   °¨ Change your position from lying down or resting to walking, or from walking to resting.   °¨ Sit and rest in a tub of warm water.   °¨ Drink 2-3 glasses of water. Dehydration may cause these contractions.   °¨ Do slow and deep breathing several times an hour.   °WHEN SHOULD I SEEK IMMEDIATE MEDICAL CARE? °Seek immediate medical care if: °· Your contractions become stronger, more regular, and closer together.   °· You have fluid leaking or gushing from your vagina.   °· You have a fever.    °· You have vaginal bleeding.   °· You have continuous abdominal pain.   °· You have low back pain that you never had before.   °· You feel your baby's head pushing down and causing pelvic pressure.   °· Your baby is not moving as much as it used to.   °Document Released: 01/09/2005 Document Revised: 01/14/2013 Document Reviewed: 10/21/2012 °ExitCare® Patient Information ©2015 [employer]. This information is not intended to replace advice given to you by your health care provider. Make sure you discuss any   questions you have with your health care provider. Supraventricular Tachycardia Supraventricular tachycardia (SVT) is an abnormal heart rhythm (arrhythmia) that causes the heart to beat very fast (tachycardia). This kind of fast heartbeat originates in the upper chambers of the heart (atria). SVT can cause the heart to beat greater than 100 beats per minute. SVT can have a rapid burst of heartbeats. This can start and stop suddenly without warning  and is called nonsustained. SVT can also be sustained, in which the heart beats at a continuous fast rate.  CAUSES  There can be different causes of SVT. Some of these include:  Heart valve problems such as mitral valve prolapse.  An enlarged heart (hypertrophic cardiomyopathy).  Congenital heart problems.  Heart inflammation (pericarditis).  Hyperthyroidism.  Low potassium or magnesium levels.  Caffeine.  Drug use such as cocaine, methamphetamines, or stimulants.  Some over-the-counter medicines such as:  Decongestants.  Diet medicines.  Herbal medicines. SYMPTOMS  Symptoms of SVT can vary. Symptoms depend on whether the SVT is sustained or nonsustained. You may experience:  No symptoms (asymptomatic).  An awareness of your heart beating rapidly (palpitations).  Shortness of breath.  Chest pain or pressure. If your blood pressure drops because of the SVT, you may experience:  Fainting or near fainting.  Weakness.  Dizziness. DIAGNOSIS  Different tests can be performed to diagnose SVT, such as:  An electrocardiogram (EKG). This is a painless test that records the electrical activity of your heart.  Holter monitor. This is a 24 hour recording of your heart rhythm. You will be given a diary. Write down all symptoms that you have and what you were doing at the time you experienced symptoms.  Arrhythmia monitor. This is a small device that your wear for several weeks. It records the heart rhythm when you have symptoms.  Echocardiogram. This is an imaging test to help detect abnormal heart structure such as congenital abnormalities, heart valve problems, or heart enlargement.  Stress test. This test can help determine if the SVT is related to exercise.  Electrophysiology study (EPS). This is a procedure that evaluates your heart's electrical system and can help your caregiver find the cause of your SVT. TREATMENT  Treatment of SVT depends on the symptoms, how  often it recurs, and whether there are any underlying heart problems.   If symptoms are rare and no other cardiac disease is present, no treatment may be needed.  Blood work may be done to check potassium, magnesium, and thyroid hormone levels to see if they are abnormal. If these levels are abnormal, treatment to correct the problems will occur. Medicines Your caregiver may use oral medicines to treat SVT. These medicines are given for long-term control of SVT. Medicines may be used alone or in combination with other treatments. These medicines work to slow nerve impulses in the heart muscle. These medicines can also be used to treat high blood pressure. Some of these medicines may include:  Calcium channel blockers.  Beta blockers.  Digoxin. Nonsurgical procedures Nonsurgical techniques may be used if oral medicines do not work. Some examples include:  Cardioversion. This technique uses either drugs or an electrical shock to restore a normal heart rhythm.  Cardioversion drugs may be given through an intravenous (IV) line to help "reset" the heart rhythm.  In electrical cardioversion, the caregiver shocks your heart to stop its beat for a split second. This helps to reset the heart to a normal rhythm.  Ablation. This procedure is done under mild  sedation. High frequency radio wave energy is used to destroy the area of heart tissue responsible for the SVT. HOME CARE INSTRUCTIONS   Do not smoke.  Only take medicines prescribed by your caregiver. Check with your caregiver before using over-the-counter medicines.  Check with your caregiver about how much alcohol and caffeine (coffee, tea, colas, or chocolate) you may have.  It is very important to keep all follow-up referrals and appointments in order to properly manage this problem. SEEK IMMEDIATE MEDICAL CARE IF:  You have dizziness.  You faint or nearly faint.  You have shortness of breath.  You have chest pain or  pressure.  You have sudden nausea or vomiting.  You have profuse sweating.  You are concerned about how long your symptoms last.  You are concerned about the frequency of your SVT episodes. If you have the above symptoms, call your local emergency services (911 in [place] immediately. Do not drive yourself to the hospital. MAKE SURE YOU:   Understand these instructions.  Will watch your condition.  Will get help right away if you are not doing well or get worse. Document Released: 01/09/2005 Document Revised: 04/03/2011 Document Reviewed: 04/23/2008 [facility] Patient Information 2015 [employer] [place]. This information is not intended to replace advice given to you by your health care provider. Make sure you discuss any questions you have with your health care provider.

## 2014-06-18 ENCOUNTER — Encounter (HOSPITAL_COMMUNITY): Payer: Self-pay | Admitting: *Deleted

## 2014-06-18 ENCOUNTER — Inpatient Hospital Stay (HOSPITAL_COMMUNITY)
Admission: AD | Admit: 2014-06-18 | Discharge: 2014-06-20 | DRG: 775 | Disposition: A | Discharge status: Home / Self Care | Payer: 59 | Source: Ambulatory Visit | Attending: Obstetrics and Gynecology | Admitting: Obstetrics and Gynecology

## 2014-06-18 DIAGNOSIS — Z3A37 37 weeks gestation of pregnancy: Secondary | ICD-10-CM | POA: Diagnosis present

## 2014-06-18 DIAGNOSIS — O99824 Streptococcus B carrier state complicating childbirth: Secondary | ICD-10-CM | POA: Diagnosis present

## 2014-06-18 DIAGNOSIS — O403XX0 Polyhydramnios, third trimester, not applicable or unspecified: Secondary | ICD-10-CM | POA: Diagnosis present

## 2014-06-18 DIAGNOSIS — B951 Streptococcus, group B, as the cause of diseases classified elsewhere: Secondary | ICD-10-CM | POA: Diagnosis present

## 2014-06-18 DIAGNOSIS — Z3483 Encounter for supervision of other normal pregnancy, third trimester: Secondary | ICD-10-CM | POA: Diagnosis present

## 2014-06-18 LAB — CBC
HCT: 35.9 % — ABNORMAL LOW (ref 36.0–46.0)
HCT: 35.4 % — ABNORMAL LOW (ref 36.0–46.0)
Hemoglobin: 11.9 g/dL — ABNORMAL LOW (ref 12.0–15.0)
Hemoglobin: 11.6 g/dL — ABNORMAL LOW (ref 12.0–15.0)
MCH: 29.2 pg (ref 26.0–34.0)
MCH: 29 pg (ref 26.0–34.0)
MCHC: 33.1 g/dL (ref 30.0–36.0)
MCHC: 32.8 g/dL (ref 30.0–36.0)
MCV: 88.2 fL (ref 78.0–100.0)
MCV: 88.5 fL (ref 78.0–100.0)
Platelets: 204 10*3/uL (ref 150–400)
Platelets: 187 10*3/uL (ref 150–400)
RBC: 4.07 MIL/uL (ref 3.87–5.11)
RBC: 4 MIL/uL (ref 3.87–5.11)
RDW: 13.4 % (ref 11.5–15.5)
RDW: 13.3 % (ref 11.5–15.5)
WBC: 7.5 10*3/uL (ref 4.0–10.5)
WBC: 8.8 10*3/uL (ref 4.0–10.5)

## 2014-06-18 LAB — TYPE AND SCREEN
ABO/RH(D): O POS
Antibody Screen: NEGATIVE

## 2014-06-18 LAB — RPR: RPR Ser Ql: NONREACTIVE

## 2014-06-18 LAB — ABO/RH: ABO/RH(D): O POS

## 2014-06-18 MED ORDER — MEASLES, MUMPS & RUBELLA VAC ~~LOC~~ INJ
0.5000 mL | INJECTION | Freq: Once | SUBCUTANEOUS | Status: DC
  Filled 2014-06-18: qty 0.5

## 2014-06-18 MED ORDER — DIBUCAINE 1 % RE OINT: 1.0000 "application " | TOPICAL_OINTMENT | RECTAL | Status: DC | PRN

## 2014-06-18 MED ORDER — LACTATED RINGERS IV SOLN
INTRAVENOUS | Status: DC
  Administered 2014-06-18: 06:00:00 via INTRAVENOUS

## 2014-06-18 MED ORDER — VANCOMYCIN HCL IN DEXTROSE 1-5 GM/200ML-% IV SOLN
1000.0000 mg | Freq: Two times a day (BID) | INTRAVENOUS | Status: DC
  Administered 2014-06-18: 1000 mg via INTRAVENOUS
  Filled 2014-06-18: qty 200

## 2014-06-18 MED ORDER — MAGNESIUM HYDROXIDE 400 MG/5ML PO SUSP: 30.0000 mL | ORAL | Status: DC | PRN

## 2014-06-18 MED ORDER — TETANUS-DIPHTH-ACELL PERTUSSIS 5-2.5-18.5 LF-MCG/0.5 IM SUSP: 0.5000 mL | Freq: Once | INTRAMUSCULAR | Status: DC

## 2014-06-18 MED ORDER — METHYLERGONOVINE MALEATE 0.2 MG PO TABS: 0.2000 mg | ORAL_TABLET | ORAL | Status: DC | PRN

## 2014-06-18 MED ORDER — BENZOCAINE-MENTHOL 20-0.5 % EX AERO
1.0000 "application " | INHALATION_SPRAY | CUTANEOUS | Status: DC | PRN
  Administered 2014-06-18: 1 via TOPICAL
  Filled 2014-06-18: qty 56

## 2014-06-18 MED ORDER — OXYCODONE-ACETAMINOPHEN 5-325 MG PO TABS: 2.0000 | ORAL_TABLET | ORAL | Status: DC | PRN

## 2014-06-18 MED ORDER — BUTORPHANOL TARTRATE 1 MG/ML IJ SOLN
1.0000 mg | INTRAMUSCULAR | Status: DC | PRN
  Administered 2014-06-18 (×2): 1 mg via INTRAVENOUS
  Filled 2014-06-18 (×2): qty 1

## 2014-06-18 MED ORDER — OXYTOCIN BOLUS FROM INFUSION: 500.0000 mL | INTRAVENOUS | Status: DC

## 2014-06-18 MED ORDER — OXYTOCIN 40 UNITS IN LACTATED RINGERS INFUSION - SIMPLE MED
INTRAVENOUS | Status: AC
  Filled 2014-06-18: qty 1000

## 2014-06-18 MED ORDER — ZOLPIDEM TARTRATE 5 MG PO TABS: 5.0000 mg | ORAL_TABLET | Freq: Every evening | ORAL | Status: DC | PRN

## 2014-06-18 MED ORDER — ONDANSETRON HCL 4 MG PO TABS
4.0000 mg | ORAL_TABLET | ORAL | Status: DC | PRN
Start: 2014-06-18 — End: 2014-06-20

## 2014-06-18 MED ORDER — DIPHENHYDRAMINE HCL 25 MG PO CAPS: 25.0000 mg | ORAL_CAPSULE | Freq: Four times a day (QID) | ORAL | Status: DC | PRN

## 2014-06-18 MED ORDER — OXYTOCIN 40 UNITS IN LACTATED RINGERS INFUSION - SIMPLE MED: 62.5000 mL/h | INTRAVENOUS | Status: DC

## 2014-06-18 MED ORDER — SIMETHICONE 80 MG PO CHEW: 80.0000 mg | CHEWABLE_TABLET | ORAL | Status: DC | PRN

## 2014-06-18 MED ORDER — FLEET ENEMA 7-19 GM/118ML RE ENEM: 1.0000 | ENEMA | RECTAL | Status: DC | PRN

## 2014-06-18 MED ORDER — OXYCODONE-ACETAMINOPHEN 5-325 MG PO TABS
1.0000 | ORAL_TABLET | ORAL | Status: DC | PRN
  Administered 2014-06-18 – 2014-06-19 (×3): 1 via ORAL
  Filled 2014-06-18 (×3): qty 1

## 2014-06-18 MED ORDER — PRENATAL MULTIVITAMIN CH
1.0000 | ORAL_TABLET | Freq: Every day | ORAL | Status: DC
  Administered 2014-06-18 – 2014-06-20 (×3): 1 via ORAL
  Filled 2014-06-18 (×4): qty 1

## 2014-06-18 MED ORDER — LANOLIN HYDROUS EX OINT: TOPICAL_OINTMENT | CUTANEOUS | Status: DC | PRN

## 2014-06-18 MED ORDER — IBUPROFEN 600 MG PO TABS
600.0000 mg | ORAL_TABLET | Freq: Four times a day (QID) | ORAL | Status: DC
  Administered 2014-06-18 – 2014-06-20 (×9): 600 mg via ORAL
  Filled 2014-06-18 (×9): qty 1

## 2014-06-18 MED ORDER — ONDANSETRON HCL 4 MG/2ML IJ SOLN: 4.0000 mg | INTRAMUSCULAR | Status: DC | PRN

## 2014-06-18 MED ORDER — LIDOCAINE HCL (PF) 1 % IJ SOLN
INTRAMUSCULAR | Status: AC
  Filled 2014-06-18: qty 30

## 2014-06-18 MED ORDER — ACETAMINOPHEN 325 MG PO TABS: 650.0000 mg | ORAL_TABLET | ORAL | Status: DC | PRN

## 2014-06-18 MED ORDER — ONDANSETRON HCL 4 MG/2ML IJ SOLN: 4.0000 mg | Freq: Four times a day (QID) | INTRAMUSCULAR | Status: DC | PRN

## 2014-06-18 MED ORDER — OXYCODONE-ACETAMINOPHEN 5-325 MG PO TABS
1.0000 | ORAL_TABLET | ORAL | Status: DC | PRN
  Administered 2014-06-18: 1 via ORAL
  Filled 2014-06-18: qty 1

## 2014-06-18 MED ORDER — METHYLERGONOVINE MALEATE 0.2 MG/ML IJ SOLN: 0.2000 mg | INTRAMUSCULAR | Status: DC | PRN

## 2014-06-18 MED ORDER — METOPROLOL TARTRATE 25 MG PO TABS
25.0000 mg | ORAL_TABLET | Freq: Two times a day (BID) | ORAL | Status: DC
  Administered 2014-06-18 – 2014-06-20 (×5): 25 mg via ORAL
  Filled 2014-06-18 (×7): qty 1

## 2014-06-18 MED ORDER — LIDOCAINE HCL (PF) 1 % IJ SOLN
30.0000 mL | INTRAMUSCULAR | Status: DC | PRN
Start: 2014-06-18 — End: 2014-06-18
  Filled 2014-06-18: qty 30

## 2014-06-18 MED ORDER — OXYTOCIN 40 UNITS IN LACTATED RINGERS INFUSION - SIMPLE MED: 62.5000 mL/h | INTRAVENOUS | Status: DC | PRN

## 2014-06-18 MED ORDER — CITRIC ACID-SODIUM CITRATE 334-500 MG/5ML PO SOLN
30.0000 mL | ORAL | Status: DC | PRN
Start: 2014-06-18 — End: 2014-06-18

## 2014-06-18 MED ORDER — LACTATED RINGERS IV SOLN: 500.0000 mL | INTRAVENOUS | Status: DC | PRN

## 2014-06-18 MED ORDER — SENNOSIDES-DOCUSATE SODIUM 8.6-50 MG PO TABS
2.0000 | ORAL_TABLET | ORAL | Status: DC
  Administered 2014-06-19 (×2): 2 via ORAL
  Filled 2014-06-18 (×2): qty 2

## 2014-06-18 MED ORDER — WITCH HAZEL-GLYCERIN EX PADS: 1.0000 "application " | MEDICATED_PAD | CUTANEOUS | Status: DC | PRN

## 2014-06-18 NOTE — MAU Note (Signed)
Pt to be admitted with routine L&D orders and Vancomycin IV for GBS positive.

## 2014-06-18 NOTE — MAU Note (Signed)
Pt states that labor began at 2230 and has become stronger. Pt denies leaking of fluid but states that she is having some bloody show. Pt states baby has been active.

## 2014-06-18 NOTE — Lactation Note (Addendum)
This note was copied from the chart of Holly [name]. Lactation Consultation Note Experienced BF mother reports that BF is going well.  Hand expression taught with colostrum easily expressible.  Cue based feeding reviewed.  She denies any questions for the lactation consultant. Information given on support group and outpatient services. Patient Name: Holly [name] ZOXWR'UToday's Date: 06/18/2014 Reason for consult: Initial assessment   Maternal Data Has patient been taught Hand Expression?: Yes Does the patient have breastfeeding experience prior to this delivery?: Yes  Feeding Feeding Type: Breast Fed Length of feed: 2 min  LATCH Score/Interventions                      Lactation Tools Discussed/Used     Consult Status Consult Status: Follow-up Date: 06/19/14 Follow-up type: In-patient    Soyla DryerJoseph, Cordai Rodrigue 06/18/2014, 5:47 PM

## 2014-06-18 NOTE — H&P (Signed)
Ciro BackerChristina L Amore is a 39 y.o. femaleG4 P2012 at 7339 5/7 weeks c/w revised by a 9 week ultrasound presenting for ruptured membranes and contractions. ROM confirmed. Pt was 1-2 cm, did not require pain medication.  Progressed to 9 cm in MAU in less than 2 hours.  SVD normal with 2nd degree laceration attended by Dr. Charlotta Newtonzan.  PNC complicated by SVT which started in the second trimester.  Followed by Cardiology.  Pt started on Metaprolol 25 mg BID after several episodes of SVT.  Has been in seen in MAU several times for chemical cardioversion with Adenosine. Pt has a h/o genital herpes.  No outbreaks this pregnancy.  Has been on Valtrex.  Pt examined ~ ~14 hours prior to delivery.  No lesions seen.   Maternal Medical History:  Reason for admission: Rupture of membranes.   Contractions: Onset was 13-24 hours ago.   Frequency: regular.   Perceived severity is moderate.    Fetal activity: Perceived fetal activity is normal.    Prenatal complications: Polyhydramnios.   Pyelectasis.  Prenatal Complications - Diabetes: none.    OB History    Gravida Para Term Preterm AB TAB SAB Ectopic Multiple Living   4 3 3  0 1 0 1 0 0 2     Past Medical History  Diagnosis Date  . Tachycardia   . Vaginal Pap smear, abnormal   . SVT (supraventricular tachycardia)    Past Surgical History  Procedure Laterality Date  . Leep     Family History: family history includes Diabetes in her father; Heart disease in her mother; Heart failure in her maternal grandmother; Hypertension in her mother. Social History:  reports that she has never smoked. She has never used smokeless tobacco. She reports that she does not drink alcohol or use illicit drugs.   Prenatal Transfer Tool  Maternal Diabetes: No Genetic Screening: Normal Maternal Ultrasounds/Referrals: Abnormal:  Findings:   Fetal renal pyelectasis, polyhydramnios-Resolved Fetal Ultrasounds or other Referrals:  Referred to Materal Fetal Medicine  Maternal  Substance Abuse:  No Significant Maternal Medications:  Meds include: Other: Valtrex, Metoprolol, Adenosine. Significant Maternal Lab Results:  Lab values include: Group B Strep positive Other Comments:  Pt with h/o SVT.    Review of Systems  Gastrointestinal: Positive for abdominal pain.    Dilation: 10 Effacement (%): 100 Station: +1 Exam by:: J Cox RN Blood pressure 117/68, pulse 85, temperature 98.3 F (36.8 C), temperature source Oral, resp. rate 18, SpO2 98 %, unknown if currently breastfeeding. Maternal Exam:  Introitus: Normal vulva. Normal vagina.    Physical Exam  Constitutional: She is oriented to person, place, and time. She appears well-developed and well-nourished. She appears distressed.  GI: Soft. There is no tenderness.  Genitourinary:  No labial or vaginal lesions suspicious for genital herpes.  Musculoskeletal: Normal range of motion.  Neurological: She is alert and oriented to person, place, and time.  Skin: Skin is warm and dry.  Psychiatric: Her behavior is normal.    Prenatal labs: ABO, Rh: --/--/O POS, O POS (05/26 11910625) Antibody: NEG (05/26 0625) Rubella:   RPR: Non Reactive (05/26 0625)  HBsAg: Negative (12/03 0000)  HIV: Non-reactive (10/20 0000)  GBS: Positive (05/10 0000)   Assessment/Plan: IUP at 39 5/7 weeks S/p SVD GBS+ PCN allergic.  Incomplete infusion of Vancomycin prior to delivery. H/o genital herpes on Valtrex.  No active lesions. H/o SVT. Cont Metaprolol.  Routine postpartum care.  Lactation consult.  [name], Arlander Gillen 06/18/2014, 3:50 PM

## 2014-06-19 DIAGNOSIS — B951 Streptococcus, group B, as the cause of diseases classified elsewhere: Secondary | ICD-10-CM | POA: Diagnosis present

## 2014-06-19 LAB — CBC
HCT: 30.5 % — ABNORMAL LOW (ref 36.0–46.0)
Hemoglobin: 9.9 g/dL — ABNORMAL LOW (ref 12.0–15.0)
MCH: 28.7 pg (ref 26.0–34.0)
MCHC: 32.5 g/dL (ref 30.0–36.0)
MCV: 88.4 fL (ref 78.0–100.0)
Platelets: 178 10*3/uL (ref 150–400)
RBC: 3.45 MIL/uL — ABNORMAL LOW (ref 3.87–5.11)
RDW: 13.6 % (ref 11.5–15.5)
WBC: 8.6 10*3/uL (ref 4.0–10.5)

## 2014-06-19 NOTE — Progress Notes (Signed)
Post Partum Day 1 s/p SVD Subjective: no complaints, up ad lib, voiding and tolerating PO  Objective: Blood pressure 119/67, pulse 78, temperature 98 F (36.7 C), temperature source Oral, resp. rate 19, height 5\' 4"  (1.626 m), weight 176 lb (79.833 kg), SpO2 99 %, unknown if currently breastfeeding.  Physical Exam:  General: alert and cooperative Lochia: appropriate Uterine Fundus: firm Incision: NA DVT Evaluation: No evidence of DVT seen on physical exam.   Recent Labs  06/18/14 0910 06/19/14 0615  HGB 11.6* 9.9*  HCT 35.4* 30.5*    Assessment/Plan: Plan for discharge tomorrow   routine postpartum care.  Pt to follow up with Dr. Dion BodyVarnado for ppv in 2 wks    LOS: 1 day   Henretta Quist J. 06/19/2014, 8:15 AM
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
## 2014-06-20 LAB — RUBELLA SCREEN: Rubella: 9.42 index (ref >0.99)

## 2014-06-20 MED ORDER — OXYCODONE-ACETAMINOPHEN 5-325 MG PO TABS: 1.0000 | ORAL_TABLET | Freq: Four times a day (QID) | ORAL | Status: DC | PRN

## 2014-06-20 NOTE — Progress Notes (Signed)
Post Partum Day 2 s/p SVD Subjective:  Patient resting comfortably in bed, no acute complaints.  No F/C/CP/SOB.  Tolerating general diet.  +flatus, +BM. Ambulating and voiding without difficulty.   Objective: Blood pressure 114/75, pulse 59, temperature 97.9 F (36.6 C), temperature source Oral, resp. rate 18, height 5\' 4"  (1.626 m), weight 79.833 kg (176 lb), SpO2 100 %, unknown if currently breastfeeding.  Physical Exam:  General: alert and cooperative  CV:  +systolic murmur, regular rate Lungs: CTAB Abd: soft, non-tender, uterus firm below umbilicus Lochia: appropriate DVT Evaluation: No evidence of DVT seen on physical exam.  No edema bilaterally   Recent Labs  06/18/14 0910 06/19/14 0615  HGB 11.6* 9.9*  HCT 35.4* 30.5*    Assessment/Plan: [name] s/p NSVD, PPD#2 -SVT: Pt to continue Metroprolol twice daily -meeting postpartum milestones appropriately, plan for discharge home today Holly HidalgoJennifer Nkenge Sonntag, DO [phone] (office)     LOS: 2 days   Holly HidalgoZAN, Holly [name], M 06/20/2014, 9:29 AM

## 2014-06-20 NOTE — Discharge Summary (Signed)
Obstetric Discharge Summary Reason for Admission: onset of labor Prenatal Procedures: none Intrapartum Procedures: spontaneous vaginal delivery and GBS prophylaxis Postpartum Procedures: none Complications-Operative and Postpartum: 2nd degree perineal laceration HEMOGLOBIN  Date Value Ref Range Status  06/19/2014 9.9* 12.0 - 15.0 g/dL Final   HCT  Date Value Ref Range Status  06/19/2014 30.5* 36.0 - 46.0 % Final   Hospital Course: Holly [name] is a 39 y.o. femaleG4 P2012 at 7239 5/7 weeks c/w revised by a 9 week ultrasound presenting for ruptured membranes and contractions. ROM confirmed. Pt was 1-2 cm, did not require pain medication. Progressed to 9 cm in MAU in less than 2 hours. SVD normal with 2nd degree laceration attended by Dr. Charlotta Newtonzan.  Due to precipitous delivery, pt did not receive complete dose of vancomycin prior to delivery.   Prenatal care complicated by SVT which started in the second trimester. Followed by Cardiology. Pt started on Metaprolol 25 mg BID after several episodes of SVT. Has been in seen in MAU several times for chemical cardioversion with Adenosine. Pt has a h/o genital herpes. No outbreaks this pregnancy. Has been on Valtrex. Pt examined ~ ~14 hours prior to delivery. No lesions seen.  Her postpartum course was uncomplicated and she was discharged home in stable condition on postpartum Day #2.   Physical Exam:  General: alert and cooperative  CV: +systolic murmur, regular rate Lungs: CTAB Abd: soft, non-tender, uterus firm below umbilicus Lochia: appropriate DVT Evaluation: No evidence of DVT seen on physical exam. No edema bilaterally  Discharge Diagnoses: Term Pregnancy-delivered  Discharge Information: Date: 06/20/2014 Activity: pelvic rest Diet: routine Medications: PNV, Ibuprofen and Percocet Condition: stable Instructions: refer to practice specific booklet Discharge to: home Follow-up Information    Follow up with Holly [name] In 6 weeks.   Specialty:  Obstetrics and Gynecology   Contact information:   [address] [place] [address] [phone]       Newborn Data: Live born female  Birth Weight: 6 lb 12.6 oz (3080 g) APGAR: 8, 9  Home with mother.  Holly [name] 06/20/2014, 10:25 PM

## 2014-06-20 NOTE — Lactation Note (Signed)
This note was copied from the chart of Girl Marquis LunchChristina Didion. Lactation Consultation Note  Patient Name: Girl Marquis LunchChristina Dollens WUJWJ'XToday's Date: 06/20/2014 Reason for consult: Follow-up assessment  Baby is 51 hours old and at 6 % weight loss. According to the doc flow sheets baby has been consistent at the breast. Mom reports breast are not filling yet but has been hearing swallows. Voids and stools adequate for age, and BIli check WNL. Sore nipple and engorgement prevention and tx reviewed. Mom denies soreness, or fullness yet. Referred to the Baby and me  Booklet pages 24 -25. LC reviewed and demo hand pump.  Mom is a [facility] - [facility] - healthy pregnancy and has requested her DEBP before D/C . Not sure the type yet and will call LC.  Mother informed of post-discharge support and given phone number to the lactation department, including services for phone call  assistance; out-patient appointments; and breastfeeding support group. List of other breastfeeding resources in the community given  in the handout. Encouraged mother to call for problems or concerns related to breastfeeding.    Maternal Data    Feeding Feeding Type:  (per mom baby recently breast fed ) Length of feed: 45 min (per mom )  LATCH Score/Interventions                      Lactation Tools Discussed/Used Tools: Pump Breast pump type: Manual Pump Review: Setup, frequency, and cleaning;Milk Storage Initiated by:: MAI  Date initiated:: 06/20/14   Consult Status Consult Status: Complete Date: 06/20/14    Kathrin Greathouseorio, Cree Napoli Ann 06/20/2014, 10:50 AM

## 2014-06-20 NOTE — Discharge Instructions (Addendum)
Vaginal Delivery, Care After Refer to this sheet in the next few weeks. These discharge instructions provide you with information on caring for yourself after delivery. Your caregiver may also give you specific instructions. Your treatment has been planned according to the most current medical practices available, but problems sometimes occur. Call your caregiver if you have any problems or questions after you go home. HOME CARE INSTRUCTIONS  Take over-the-counter or prescription medicines only as directed by your caregiver or pharmacist.  Percocet may cause significant constipation, please be sure to take stool softeners (ex. Colace) as needed.  Do not drink alcohol, especially if you are breastfeeding or taking medicine to relieve pain.  Do not chew or smoke tobacco.  Do not use illegal drugs.  Continue to use good perineal care. Good perineal care includes:  Wiping your perineum from front to back.  Keeping your perineum clean.  Do not use tampons or douche until your caregiver says it is okay.  Shower, wash your hair, and take tub baths as directed by your caregiver.  Wear a well-fitting bra that provides breast support.  Eat healthy foods.  Drink enough fluids to keep your urine clear or pale yellow.  Eat high-fiber foods such as whole grain cereals and breads, brown rice, beans, and fresh fruits and vegetables every day. These foods may help prevent or relieve constipation.  Follow your caregiver's recommendations regarding resumption of activities such as climbing stairs, driving, lifting, exercising, or traveling.  Talk to your caregiver about resuming sexual activities. Resumption of sexual activities is dependent upon your risk of infection, your rate of healing, and your comfort and desire to resume sexual activity.  Try to have someone help you with your household activities and your newborn for at least a few days after you leave the hospital.  Rest as much as  possible. Try to rest or take a nap when your newborn is sleeping.  Increase your activities gradually.  Keep all of your scheduled postpartum appointments. It is very important to keep your scheduled follow-up appointments. At these appointments, your caregiver will be checking to make sure that you are healing physically and emotionally. SEEK MEDICAL CARE IF:   You are passing large clots from your vagina. Save any clots to show your caregiver.  You have a foul smelling discharge from your vagina.  You have trouble urinating.  You are urinating frequently.  You have pain when you urinate.  You have a change in your bowel movements.  You have increasing redness, pain, or swelling near your vaginal incision (episiotomy) or vaginal tear.  You have pus draining from your episiotomy or vaginal tear.  Your episiotomy or vaginal tear is separating.  You have painful, hard, or reddened breasts.  You have a severe headache.  You have blurred vision or see spots.  You feel sad or depressed.  You have thoughts of hurting yourself or your newborn.  You have questions about your care, the care of your newborn, or medicines.  You are dizzy or light-headed.  You have a rash.  You have nausea or vomiting.  You were breastfeeding and have not had a menstrual period within 12 weeks after you stopped breastfeeding.  You are not breastfeeding and have not had a menstrual period by the 12th week after delivery.  You have a fever. SEEK IMMEDIATE MEDICAL CARE IF:   You have persistent pain.  You have chest pain.  You have shortness of breath.  You faint.  You have  leg pain.  You have stomach pain.  Your vaginal bleeding saturates two or more sanitary pads in 1 hour. MAKE SURE YOU:   Understand these instructions.  Will watch your condition.  Will get help right away if you are not doing well or get worse. Document Released: 01/07/2000 Document Revised: 05/26/2013  Document Reviewed: 09/06/2011 [facility] Patient Information 2015 [employer], [place]. This information is not intended to replace advice given to you by your health care provider. Make sure you discuss any questions you have with your health care provider.

## 2014-06-27 ENCOUNTER — Telehealth: Payer: Self-pay

## 2014-06-27 NOTE — Telephone Encounter (Signed)
Patient reports bleeding from urethral area and blood in urine despite cipro.  Patient reports starting cipro on Wednesday and states symptoms are the same. Patient reports "it just hurts" when describing urination. Patient reports perineal laceration and in and out catheter after delivery.  Reports vaginal bleeding is light and can differentiate bleeding from urethra and vagina.  Patient informed that further evaluation would be necessary and instructed to report to MAU.  Patient verbalized understanding.  JE, CNM

## 2014-06-28 ENCOUNTER — Inpatient Hospital Stay (HOSPITAL_COMMUNITY)
Admission: AD | Admit: 2014-06-28 | Discharge: 2014-06-28 | Disposition: A | Discharge status: Home / Self Care | Payer: 59 | Source: Ambulatory Visit | Attending: Obstetrics and Gynecology | Admitting: Obstetrics and Gynecology

## 2014-06-28 ENCOUNTER — Encounter (HOSPITAL_COMMUNITY): Payer: Self-pay | Admitting: *Deleted

## 2014-06-28 DIAGNOSIS — I158 Other secondary hypertension: Secondary | ICD-10-CM | POA: Insufficient documentation

## 2014-06-28 DIAGNOSIS — N390 Urinary tract infection, site not specified: Secondary | ICD-10-CM | POA: Diagnosis not present

## 2014-06-28 DIAGNOSIS — O9089 Other complications of the puerperium, not elsewhere classified: Secondary | ICD-10-CM | POA: Insufficient documentation

## 2014-06-28 DIAGNOSIS — R319 Hematuria, unspecified: Secondary | ICD-10-CM | POA: Diagnosis not present

## 2014-06-28 DIAGNOSIS — O165 Unspecified maternal hypertension, complicating the puerperium: Secondary | ICD-10-CM

## 2014-06-28 DIAGNOSIS — O8620 Urinary tract infection following delivery, unspecified: Secondary | ICD-10-CM | POA: Diagnosis not present

## 2014-06-28 LAB — COMPREHENSIVE METABOLIC PANEL
ALT: 54 U/L (ref 14–54)
AST: 36 U/L (ref 15–41)
Albumin: 2.9 g/dL — ABNORMAL LOW (ref 3.5–5.0)
Alkaline Phosphatase: 91 U/L (ref 38–126)
Anion gap: 3 — ABNORMAL LOW (ref 5–15)
BUN: 17 mg/dL (ref 6–20)
CO2: 25 mmol/L (ref 22–32)
Calcium: 8.2 mg/dL — ABNORMAL LOW (ref 8.9–10.3)
Chloride: 114 mmol/L — ABNORMAL HIGH (ref 101–111)
Creatinine, Ser: 0.69 mg/dL (ref 0.44–1.00)
GFR calc Af Amer: >60 mL/min (ref >60)
GFR calc non Af Amer: >60 mL/min (ref >60)
Glucose, Bld: 101 mg/dL — ABNORMAL HIGH (ref 65–99)
Potassium: 3.6 mmol/L (ref 3.5–5.1)
Sodium: 142 mmol/L (ref 135–145)
Total Bilirubin: 0.5 mg/dL (ref 0.3–1.2)
Total Protein: 5.9 g/dL — ABNORMAL LOW (ref 6.5–8.1)

## 2014-06-28 LAB — URINALYSIS, ROUTINE W REFLEX MICROSCOPIC
Glucose, UA: NEGATIVE mg/dL
Ketones, ur: 15 mg/dL — AB
Nitrite: NEGATIVE
Protein, ur: 30 mg/dL — AB
Specific Gravity, Urine: >1.03 — ABNORMAL HIGH (ref 1.005–1.030)
Urobilinogen, UA: 0.2 mg/dL (ref 0.0–1.0)
pH: 5.5 (ref 5.0–8.0)

## 2014-06-28 LAB — CBC
HCT: 32.5 % — ABNORMAL LOW (ref 36.0–46.0)
Hemoglobin: 10.5 g/dL — ABNORMAL LOW (ref 12.0–15.0)
MCH: 28.9 pg (ref 26.0–34.0)
MCHC: 32.3 g/dL (ref 30.0–36.0)
MCV: 89.5 fL (ref 78.0–100.0)
Platelets: 268 10*3/uL (ref 150–400)
RBC: 3.63 MIL/uL — ABNORMAL LOW (ref 3.87–5.11)
RDW: 13.6 % (ref 11.5–15.5)
WBC: 6.2 10*3/uL (ref 4.0–10.5)

## 2014-06-28 LAB — URINE MICROSCOPIC-ADD ON

## 2014-06-28 LAB — PROTEIN / CREATININE RATIO, URINE
Creatinine, Urine: 194 mg/dL
Protein Creatinine Ratio: 0.18 mg/mg{Cre} — ABNORMAL HIGH (ref 0.00–0.15)
Total Protein, Urine: 34 mg/dL

## 2014-06-28 MED ORDER — PHENAZOPYRIDINE HCL 200 MG PO TABS: 200.0000 mg | ORAL_TABLET | Freq: Three times a day (TID) | ORAL | Status: DC | PRN

## 2014-06-28 MED ORDER — PHENAZOPYRIDINE HCL 100 MG PO TABS
200.0000 mg | ORAL_TABLET | Freq: Once | ORAL | Status: AC
  Administered 2014-06-28: 200 mg via ORAL
  Filled 2014-06-28: qty 2

## 2014-06-28 NOTE — Progress Notes (Signed)
Bimanual exam only

## 2014-06-28 NOTE — Progress Notes (Signed)
Gerrit HeckJessica Emly CNM notified of pt's admission and status. MAU provider will see pt since pt is postpartum

## 2014-06-28 NOTE — MAU Note (Addendum)
SVD 5/26. Think i am bleeding from urethra. Pain down my sides and where my bladder is. Scant amt vag bleeding. Breastfeeding. [facility] and started Cipro for uti

## 2014-06-28 NOTE — Progress Notes (Signed)
Written and verbal d/c instructions given and understanding voiced. 

## 2014-06-28 NOTE — MAU Provider Note (Signed)
History     CSN: [id number]  Arrival date and time: 06/28/14 0224   None     Chief Complaint  Patient presents with  . Hematuria   HPI 39 y.o. [id number] at 10 days s/p SVD with complaint of dysuria and hematuria. Pt states she was seen at [facility]gent Care and treated for UTI w/ cipro 5 days ago, has had 4 doses of cipro with no relief of symptoms. Denies fever, chills, n/v, back pain. + lochia, decreased over the past few days, denies foul smelling discharge. Pregnancy was complicated only by PSVT, now weaning off of lopressor for this, denies h/o HTN during pregnancy or otherwise.  Past Medical History  Diagnosis Date  . Tachycardia   . Vaginal Pap smear, abnormal   . SVT (supraventricular tachycardia)     Past Surgical History  Procedure Laterality Date  . Leep      Family History  Problem Relation Age of Onset  . Heart disease Mother   . Hypertension Mother   . Diabetes Father   . Heart failure Maternal Grandmother     History  Substance Use Topics  . Smoking status: Never Smoker   . Smokeless tobacco: Never Used  . Alcohol Use: No    Allergies:  Allergies  Allergen Reactions  . Amoxicillin Itching    Prescriptions prior to admission  Medication Sig Dispense Refill Last Dose  . ciprofloxacin (CIPRO) 250 MG tablet Take 250 mg by mouth 2 (two) times daily.   06/27/2014 at Unknown time  . docusate sodium (COLACE) 100 MG capsule Take 100 mg by mouth 2 (two) times daily.   06/27/2014 at Unknown time  . metoprolol tartrate (LOPRESSOR) 25 MG tablet Take 1 tablet (25 mg total) by mouth 2 (two) times daily. 180 tablet 1 06/27/2014 at Unknown time  . oxyCODONE-acetaminophen (PERCOCET/ROXICET) 5-325 MG per tablet Take 1 tablet by mouth every 6 (six) hours as needed for severe pain (for pain scale 4-7). 15 tablet 0 Past Week at Unknown time  . Prenatal Vit-Fe Fumarate-FA (PRENATAL MULTIVITAMIN) TABS tablet Take 1 tablet by mouth daily at 12 noon.   06/27/2014 at Unknown time  .  valACYclovir (VALTREX) 500 MG tablet Take 500 mg by mouth 2 (two) times daily.   06/17/2014 at Unknown time    Review of Systems  Constitutional: Negative.  Negative for fever, chills and malaise/fatigue.  Eyes: Negative for blurred vision.  Respiratory: Negative.  Negative for shortness of breath.   Cardiovascular: Negative.  Negative for chest pain.  Gastrointestinal: Positive for abdominal pain. Negative for nausea, vomiting, diarrhea and constipation.  Genitourinary: Positive for dysuria and hematuria. Negative for urgency, frequency and flank pain.       Negative for vaginal bleeding, vaginal discharge, dyspareunia  Musculoskeletal: Negative.  Negative for neck pain.  Neurological: Negative.  Negative for headaches.  Psychiatric/Behavioral: Negative.    Physical Exam   Blood pressure 136/90, pulse 59, temperature 98.2 F (36.8 C), resp. rate 18, height 5\' 4"  (1.626 m), weight 170 lb 12.8 oz (77.474 kg), currently breastfeeding.  Physical Exam  Nursing note and vitals reviewed. Constitutional: She is oriented to person, place, and time. She appears well-developed and well-nourished. No distress.  Cardiovascular: Normal rate and regular rhythm.   Respiratory: Effort normal. No respiratory distress.  GI: Soft. She exhibits no distension and no mass. There is no tenderness. There is no rebound and no guarding.  Genitourinary: Uterus is enlarged (firm, smooth involuting uterus). Uterus is not tender. Vaginal discharge:  lochia wnl.  Perineal lac healing well; large diastasis recti w/ significant abdominal wall muscle laxity  Neurological: She is alert and oriented to person, place, and time. She displays normal reflexes.  1 beat of clonus LLE  Skin: Skin is warm and dry.  Psychiatric: She has a normal mood and affect.    MAU Course  Procedures Results for orders placed or performed during the hospital encounter of 06/28/14 (from the past 24 hour(s))  Urinalysis, Routine w reflex  microscopic (not at [facility])     Status: Abnormal   Collection Time: 06/28/14  2:50 AM  Result Value Ref Range   Color, Urine YELLOW YELLOW   APPearance CLOUDY (A) CLEAR   Specific Gravity, Urine >1.030 (H) 1.005 - 1.030   pH 5.5 5.0 - 8.0   Glucose, UA NEGATIVE NEGATIVE mg/dL   Hgb urine dipstick LARGE (A) NEGATIVE   Bilirubin Urine SMALL (A) NEGATIVE   Ketones, ur 15 (A) NEGATIVE mg/dL   Protein, ur 30 (A) NEGATIVE mg/dL   Urobilinogen, UA 0.2 0.0 - 1.0 mg/dL   Nitrite NEGATIVE NEGATIVE   Leukocytes, UA SMALL (A) NEGATIVE  Urine microscopic-add on     Status: Abnormal   Collection Time: 06/28/14  2:50 AM  Result Value Ref Range   WBC, UA 11-20 <3 WBC/hpf   RBC / HPF 21-50 <3 RBC/hpf   Bacteria, UA FEW (A) RARE   Urine-Other MUCOUS PRESENT   CBC     Status: Abnormal   Collection Time: 06/28/14  3:00 AM  Result Value Ref Range   WBC 6.2 4.0 - 10.5 K/uL   RBC 3.63 (L) 3.87 - 5.11 MIL/uL   Hemoglobin 10.5 (L) 12.0 - 15.0 g/dL   HCT 04.5 (L) 40.9 - 81.1 %   MCV 89.5 78.0 - 100.0 fL   MCH 28.9 26.0 - 34.0 pg   MCHC 32.3 30.0 - 36.0 g/dL   RDW 91.4 78.2 - 95.6 %   Platelets 268 150 - 400 K/uL  Comprehensive metabolic panel     Status: Abnormal   Collection Time: 06/28/14  3:00 AM  Result Value Ref Range   Sodium 142 135 - 145 mmol/L   Potassium 3.6 3.5 - 5.1 mmol/L   Chloride 114 (H) 101 - 111 mmol/L   CO2 25 22 - 32 mmol/L   Glucose, Bld 101 (H) 65 - 99 mg/dL   BUN 17 6 - 20 mg/dL   Creatinine, Ser 2.13 0.44 - 1.00 mg/dL   Calcium 8.2 (L) 8.9 - 10.3 mg/dL   Total Protein 5.9 (L) 6.5 - 8.1 g/dL   Albumin 2.9 (L) 3.5 - 5.0 g/dL   AST 36 15 - 41 U/L   ALT 54 14 - 54 U/L   Alkaline Phosphatase 91 38 - 126 U/L   Total Bilirubin 0.5 0.3 - 1.2 mg/dL   GFR calc non Af Amer >60 >60 mL/min   GFR calc Af Amer >60 >60 mL/min   Anion gap 3 (L) 5 - 15   Patient Vitals for the past 24 hrs:  BP Temp Pulse Resp Height Weight  06/28/14 0431 142/90 mmHg 97.7 F (36.5 C) 68 18 - -   06/28/14 0346 134/92 mmHg - (!) 55 - - -  06/28/14 0331 140/85 mmHg - 62 - - -  06/28/14 0316 136/90 mmHg - (!) 59 - - -  06/28/14 0309 141/89 mmHg - - - - -  06/28/14 0308 - - 60 - - -  06/28/14 0241 150/79  mmHg - - - - -  06/28/14 0237 143/93 mmHg 98.2 F (36.8 C) 69 18  (1.626 m) 170 lb 12.8 oz (77.474 kg)     Assessment and Plan   1. Urinary tract infection with hematuria, site unspecified   2. Postpartum hypertension   Per Dr. Sallye Ober, await culture results, no abx change at this time, pyridium for dysuria. Precautions rev'd, f/u in office this week or sooner w/ worsening symptoms.  No severe features w/ HTN, precautions rev'd, pt to follow up w/ Dr. Dion Body this week regarding BP as well    Medication List    STOP taking these medications        valACYclovir 500 MG tablet  Commonly known as:  VALTREX      TAKE these medications        ciprofloxacin 250 MG tablet  Commonly known as:  CIPRO  Take 250 mg by mouth 2 (two) times daily.     docusate sodium 100 MG capsule  Commonly known as:  COLACE  Take 100 mg by mouth 2 (two) times daily.     metoprolol tartrate 25 MG tablet  Commonly known as:  LOPRESSOR  Take 1 tablet (25 mg total) by mouth 2 (two) times daily.     oxyCODONE-acetaminophen 5-325 MG per tablet  Commonly known as:  PERCOCET/ROXICET  Take 1 tablet by mouth every 6 (six) hours as needed for severe pain (for pain scale 4-7).     phenazopyridine 200 MG tablet  Commonly known as:  PYRIDIUM  Take 1 tablet (200 mg total) by mouth 3 (three) times daily as needed for pain.     prenatal multivitamin Tabs tablet  Take 1 tablet by mouth daily at 12 noon.            Follow-up Information    Follow up with Geryl Rankins, MD In 2 days.   Specialty:  Obstetrics and Gynecology   Why:  or sooner if symptoms worsen   Contact information:   [address] [place] [address] [phone]         Georges Mouse 06/28/2014,  3:18 AM

## 2014-06-30 LAB — CULTURE, OB URINE: Colony Count: 10000

## 2014-07-03 ENCOUNTER — Ambulatory Visit: Payer: 59 | Admitting: Interventional Cardiology

## 2014-07-03 ENCOUNTER — Encounter: Payer: Self-pay | Admitting: Interventional Cardiology

## 2014-07-03 ENCOUNTER — Telehealth: Payer: Self-pay

## 2014-07-03 ENCOUNTER — Ambulatory Visit (INDEPENDENT_AMBULATORY_CARE_PROVIDER_SITE_OTHER): Payer: 59 | Admitting: Interventional Cardiology

## 2014-07-03 VITALS — BP 150/96 | HR 56 | Ht 62.0 in | Wt 164.8 lb

## 2014-07-03 DIAGNOSIS — O99413 Diseases of the circulatory system complicating pregnancy, third trimester: Secondary | ICD-10-CM | POA: Diagnosis not present

## 2014-07-03 DIAGNOSIS — I499 Cardiac arrhythmia, unspecified: Secondary | ICD-10-CM

## 2014-07-03 DIAGNOSIS — I471 Supraventricular tachycardia: Secondary | ICD-10-CM

## 2014-07-03 NOTE — Patient Instructions (Addendum)
Medication Instructions:  Your physician recommends that you continue on your current medications as directed. Please refer to the Current Medication list given to you today.   Labwork: None   Testing/Procedures: None   Follow-Up: Your physician wants you to follow-up in: 4-6 months with Dr.Smith You will receive a reminder letter in the mail two months in advance. If you don't receive a letter, please call our office to schedule the follow-up appointment.   Any Other Special Instructions Will Be Listed Below (If Applicable). Start a diary to keep a record of your PSVT episodes

## 2014-07-03 NOTE — Telephone Encounter (Signed)
Called to give pt Dr.Smith's recommendations. Follow a low sodium diet. Pt does not have a bp machine at home. Asked pt to have her bp measured a couple times a wk over the next 2 weeks and call our office with readings, Pt verbalized understanding.

## 2014-07-03 NOTE — Progress Notes (Signed)
Cardiology Office Note   Date:  07/03/2014   ID:  Holly [name], DOB 1975-11-02, MRN [id number]  PCP:  Cain Saupe, MD  Cardiologist:  Lesleigh Noe, MD   Chief Complaint  [name] presents with  . [name] Education  . Tachycardia      History of Present Illness: Holly [name] is a 39 y.o. female who presents for history of PSVT. She is now one month status post normal vaginal delivery. Since delivery she has had no recurrence of PSVT. She is tolerating metoprolol 25 mg twice daily. No specific side effects to Holly medical regimen.    Past Medical History  Diagnosis Date  . Tachycardia   . Vaginal Pap smear, abnormal   . SVT (supraventricular tachycardia)     Past Surgical History  Procedure Laterality Date  . Leep       Current Outpatient Prescriptions  Medication Sig Dispense Refill  . docusate sodium (COLACE) 100 MG capsule Take 100 mg by mouth 2 (two) times daily.    . metoprolol tartrate (LOPRESSOR) 25 MG tablet Take 1 tablet (25 mg total) by mouth 2 (two) times daily. 180 tablet 1  . phenazopyridine (PYRIDIUM) 200 MG tablet Take 1 tablet (200 mg total) by mouth 3 (three) times daily as needed for pain. 10 tablet 0  . Prenatal Vit-Fe Fumarate-FA (PRENATAL MULTIVITAMIN) TABS tablet Take 1 tablet by mouth daily at 12 noon.     No current facility-administered medications for this visit.    Allergies:   Amoxicillin    Social History:  Holly [name]  reports that she has never smoked. She has never used smokeless tobacco. She reports that she does not drink alcohol or use illicit drugs.   Family History:  Holly [name]'s family history includes Diabetes in her father; Heart disease in her mother; Heart failure in her maternal grandmother; Hypertension in her mother.    ROS:  Please see Holly history of present illness.   Otherwise, review of systems are positive for sleep deprivation due to new baby.   All other systems are reviewed and negative.     PHYSICAL EXAM: VS:  BP 150/96 mmHg  Pulse 56  Ht  (1.575 m)  Wt 74.753 kg (164 lb 12.8 oz)  BMI 30.13 kg/m2  SpO2 99% , BMI Body mass index is 30.13 kg/(m^2). GEN: Well nourished, well developed, in no acute distress HEENT: normal Neck: no JVD, carotid bruits, or masses Cardiac: RRR; no murmurs, rubs, or gallops,no edema  Respiratory:  clear to auscultation bilaterally, normal work of breathing GI: soft, nontender, nondistended, + BS MS: no deformity or atrophy Skin: warm and dry, no rash Neuro:  Strength and sensation are intact Psych: euthymic mood, full affect   EKG:  EKG is not ordered today.   Recent Labs: 01/21/2014: TSH 1.241 06/28/2014: ALT 54; BUN 17; Creatinine, Ser 0.69; Hemoglobin 10.5*; Platelets 268; Potassium 3.6; Sodium 142    Lipid Panel No results found for: CHOL, TRIG, HDL, CHOLHDL, VLDL, LDLCALC, LDLDIRECT    Wt Readings from Last 3 Encounters:  07/03/14 74.753 kg (164 lb 12.8 oz)  06/28/14 77.474 kg (170 lb 12.8 oz)  06/18/14 79.833 kg (176 lb)      Other studies Reviewed: Additional studies/ records that were reviewed today include:    ASSESSMENT AND PLAN:  PSVT (paroxysmal supraventricular tachycardia)- maintain Holly current medical regimen. We will let Holly clinical natural history determine whether we stay on medications, refer for EP evaluation and  ablation.  Maternal arrhythmia complicating pregnancy, third trimester  Elevated blood pressure- monitor and decrease salt intake     Current medicines are reviewed at length with Holly [name] today.  Holly [name] does not have concerns regarding medicines.  Holly following changes have been made:  no change  Labs/ tests ordered today include:  No orders of Holly defined types were placed in this encounter.     Disposition:   FU with HS in 4 months  Signed, Lesleigh Noe, MD  07/03/2014 10:53 AM    [facility] [address] [place], [place], [place]   [address] Phone: [phone]; Fax: [phone]

## 2015-01-29 MED FILL — TARON-C DHA CAPSULE: 53.5-38-1 | 30 days supply | Qty: 30 | Fill #0

## 2015-01-29 MED FILL — METOPROLOL TARTRATE 25 MG T: 25 | 90 days supply | Qty: 180 | Fill #1

## 2015-02-16 ENCOUNTER — Ambulatory Visit (INDEPENDENT_AMBULATORY_CARE_PROVIDER_SITE_OTHER): Payer: 59 | Admitting: Interventional Cardiology

## 2015-02-16 DIAGNOSIS — I471 Supraventricular tachycardia: Secondary | ICD-10-CM

## 2015-02-17 NOTE — Progress Notes (Signed)
No show

## 2015-02-26 ENCOUNTER — Encounter: Payer: Self-pay | Admitting: Interventional Cardiology

## 2015-03-09 MED FILL — TARON-C DHA CAPSULE: 53.5-38-1 | 30 days supply | Qty: 30 | Fill #1

## 2015-05-18 MED FILL — TARON-C DHA CAPSULE: 53.5-38-1 | 30 days supply | Qty: 30 | Fill #2

## 2015-07-24 ENCOUNTER — Emergency Department (HOSPITAL_COMMUNITY)
Admission: EM | Admit: 2015-07-24 | Discharge: 2015-07-24 | Disposition: A | Discharge status: Home / Self Care | Payer: 59 | Attending: Emergency Medicine | Admitting: Emergency Medicine

## 2015-07-24 ENCOUNTER — Encounter (HOSPITAL_COMMUNITY): Payer: Self-pay | Admitting: Emergency Medicine

## 2015-07-24 DIAGNOSIS — Z79899 Other long term (current) drug therapy: Secondary | ICD-10-CM | POA: Diagnosis not present

## 2015-07-24 DIAGNOSIS — R519 Headache, unspecified: Secondary | ICD-10-CM

## 2015-07-24 DIAGNOSIS — R51 Headache: Secondary | ICD-10-CM | POA: Insufficient documentation

## 2015-07-24 LAB — I-STAT CHEM 8, ED
BUN: 11 mg/dL (ref 6–20)
Calcium, Ion: 1.17 mmol/L (ref 1.13–1.30)
Chloride: 102 mmol/L (ref 101–111)
Creatinine, Ser: 0.6 mg/dL (ref 0.44–1.00)
Glucose, Bld: 104 mg/dL — ABNORMAL HIGH (ref 65–99)
HCT: 39 % (ref 36.0–46.0)
Hemoglobin: 13.3 g/dL (ref 12.0–15.0)
Potassium: 4 mmol/L (ref 3.5–5.1)
Sodium: 139 mmol/L (ref 135–145)
TCO2: 26 mmol/L (ref 0–100)

## 2015-07-24 LAB — CBC WITH DIFFERENTIAL/PLATELET
Basophils Absolute: 0 10*3/uL (ref 0.0–0.1)
Basophils Relative: 0 %
Eosinophils Absolute: 0 10*3/uL (ref 0.0–0.7)
Eosinophils Relative: 0 %
HCT: 36.7 % (ref 36.0–46.0)
Hemoglobin: 11.7 g/dL — ABNORMAL LOW (ref 12.0–15.0)
Lymphocytes Relative: 18 %
Lymphs Abs: 1 10*3/uL (ref 0.7–4.0)
MCH: 27.9 pg (ref 26.0–34.0)
MCHC: 31.9 g/dL (ref 30.0–36.0)
MCV: 87.6 fL (ref 78.0–100.0)
Monocytes Absolute: 0.2 10*3/uL (ref 0.1–1.0)
Monocytes Relative: 4 %
Neutro Abs: 4.4 10*3/uL (ref 1.7–7.7)
Neutrophils Relative %: 78 %
Platelets: 214 10*3/uL (ref 150–400)
RBC: 4.19 MIL/uL (ref 3.87–5.11)
RDW: 12.5 % (ref 11.5–15.5)
WBC: 5.7 10*3/uL (ref 4.0–10.5)

## 2015-07-24 MED ORDER — DIPHENHYDRAMINE HCL 50 MG/ML IJ SOLN
25.0000 mg | Freq: Once | INTRAMUSCULAR | Status: AC
  Administered 2015-07-24: 25 mg via INTRAVENOUS
  Filled 2015-07-24: qty 1

## 2015-07-24 MED ORDER — METOCLOPRAMIDE HCL 5 MG/ML IJ SOLN
5.0000 mg | Freq: Once | INTRAMUSCULAR | Status: AC
  Administered 2015-07-24: 5 mg via INTRAVENOUS
  Filled 2015-07-24: qty 2

## 2015-07-24 MED ORDER — KETOROLAC TROMETHAMINE 15 MG/ML IJ SOLN
15.0000 mg | Freq: Once | INTRAMUSCULAR | Status: AC
  Administered 2015-07-24: 15 mg via INTRAVENOUS
  Filled 2015-07-24: qty 1

## 2015-07-24 MED ORDER — SODIUM CHLORIDE 0.9 % IV BOLUS (SEPSIS)
1000.0000 mL | Freq: Once | INTRAVENOUS | Status: AC
  Administered 2015-07-24: 1000 mL via INTRAVENOUS

## 2015-07-24 NOTE — ED Provider Notes (Signed)
CSN: [id number]     Arrival date & time 07/24/15  1956 History   First MD Initiated Contact with Patient 07/24/15 2009     Chief Complaint  Patient presents with  . Hypertension  . Migraine     (Consider location/radiation/quality/duration/timing/severity/associated sxs/prior Treatment) HPI   Patient is a 40 year old female with a history of headaches, SVT, HTN who presents the ED with hypertension and headache. Headache started around 5 PM today when patient woke. Pain started behind her eyes, progressively worsened and is now in the frontal region, nonradiating, throbbing, 8/10. Associated photosensitivity and nausea, lightheadedness and one episode of emesis. Patient states this headache is similar to her normal migraines but much worse. She can normally "sleep off" her migraines but was unable to do that with this headache. She took Tylenol with little relief. Patient states increased episodes of headaches since she started working nights roughly 1 yr ago. Patient denies dizziness, syncope, fever, chills, neck pain, visual disturbance, abdominal pain, numbness/tingling, weakness.  Past Medical History  Diagnosis Date  . Tachycardia   . Vaginal Pap smear, abnormal   . SVT (supraventricular tachycardia) [facility](HCC)    Past Surgical History  Procedure Laterality Date  . Leep     Family History  Problem Relation Age of Onset  . Heart disease Mother   . Hypertension Mother   . Diabetes Father   . Heart failure Maternal Grandmother    Social History  Substance Use Topics  . Smoking status: Never Smoker   . Smokeless tobacco: Never Used  . Alcohol Use: No   OB History    Gravida Para Term Preterm AB TAB SAB Ectopic Multiple Living   4 3 3  0 1 0 1 0 0 2     Review of Systems  Constitutional: Negative for fever and chills.  Eyes: Positive for photophobia.  Respiratory: Negative for shortness of breath.   Cardiovascular: Negative for chest pain.  Gastrointestinal: Positive for  nausea and vomiting. Negative for abdominal pain.  Genitourinary: Negative for dysuria and hematuria.  Musculoskeletal: Negative for back pain, neck pain and neck stiffness.  Skin: Negative for rash.  Neurological: Positive for light-headedness and headaches. Negative for dizziness, syncope, facial asymmetry, weakness and numbness.  Psychiatric/Behavioral: Negative for confusion.      Allergies  Amoxicillin  Home Medications   Prior to Admission medications   Medication Sig Start Date End Date Taking? Authorizing Provider  acetaminophen (TYLENOL) 325 MG tablet Take 650 mg by mouth every 6 (six) hours as needed (for pain/headache.).   Yes Historical Provider, MD  metoprolol tartrate (LOPRESSOR) 25 MG tablet Take 1 tablet (25 mg total) by mouth 2 (two) times daily. 05/31/14  Yes Rhonda G Barrett, PA-C  Prenat-FeFum-FePo-FA-Omega 3 (TARON-C DHA) 53.5-38-1 MG CAPS Take 1 capsule by mouth daily. 05/18/15  Yes Historical Provider, MD   BP 153/107 mmHg  Pulse 57  Temp(Src) 98.7 F (37.1 C) (Oral)  Resp 16  Ht 5\' 3"  (1.6 m)  Wt 74.39 kg  BMI 29.06 kg/m2  SpO2 100%  LMP 07/18/2015 Physical Exam   Constitutional: Pt is oriented to person, place, and time. Pt appears well-developed and well-nourished. No distress.  HENT:  Head: Normocephalic and atraumatic.  Mouth/Throat: Oropharynx is clear and moist.  Eyes: Conjunctivae and EOM are normal. Pupils are equal, round, and reactive to light. No scleral icterus.  No horizontal, vertical or rotational nystagmus  Neck: Normal range of motion. Neck supple.  Full active and passive ROM without pain No  nuchal rigidity or meningeal signs  Cardiovascular: Normal rate, regular rhythm and intact distal pulses, DP 2+ bilaterally.   Pulmonary/Chest: Effort normal  No respiratory distress, lungs CTA.  Abdominal: Soft. There is no tenderness. There is no rebound and no guarding.  Musculoskeletal: Normal range of motion.  Neurological: Pt. is alert and  oriented to person, place, and time. No cranial nerve deficit.  Exhibits normal muscle tone. Coordination normal.  Mental Status:  Alert, oriented, thought content appropriate. Speech fluent without evidence of aphasia. Able to follow 2 step commands without difficulty.  Cranial Nerves:  II:  Peripheral visual fields grossly normal, pupils equal, round, reactive to light III,IV, VI: ptosis not present, extra-ocular motions intact bilaterally  V,VII: smile symmetric, facial light touch sensation equal VIII: hearing grossly normal bilaterally  IX,X: midline uvula rise  XI: bilateral shoulder shrug equal and strong XII: midline tongue extension  Motor:  5/5 in upper and lower extremities bilaterally including strong and equal grip strength and dorsiflexion/plantar flexion Sensory: light touch normal in all extremities.  Cerebellar: normal finger-to-nose with bilateral upper extremities, pronator drift negative Gait: normal gait and balance Skin: Skin is warm and dry. No rash noted. Pt is not diaphoretic.  Psychiatric: Pt has a normal mood and affect. Behavior is normal. Judgment and thought content normal.  Nursing note and vitals reviewed.   ED Course  Procedures (including critical care time)  9:53 PM Pt states her headache has greatly improved and is now a 4/10. Will have her finish her bolus of fluids and reevaluated.   10:40 PM Pt states her headache is a 1/10.    Labs Review Labs Reviewed  CBC WITH DIFFERENTIAL/PLATELET - Abnormal; Notable for the following:    Hemoglobin 11.7 (*)    All other components within normal limits  I-STAT CHEM 8, ED - Abnormal; Notable for the following:    Glucose, Bld 104 (*)    All other components within normal limits    Imaging Review No results found. I have personally reviewed and evaluated these images and lab results as part of my medical decision-making.   EKG Interpretation None      MDM   Final diagnoses:  Nonintractable  headache, unspecified chronicity pattern, unspecified headache type   Pt HA treated and improved while in ED.  Presentation is like pts typical HA and non concerning for [facility]CH, Meningitis, or temporal arteritis. Pt is afebrile with no focal neuro deficits, nuchal rigidity, or change in vision. Pt is to follow up with PCP to discuss prophylactic medication. Pt BP elevated upon arrival to the ED. Pt states she does not always take her medication. I instructed the pt to take her BP medications as perscribed and f/u with her PCP regarding her elevated BP. Her BP decreased while in the ED. It could likely have been elevated 2/2 her headache.   Hemoglobin low although close to pt's baseline. Other labs unremarkable.   Discussed strict return precautions. Pt verbalizes understanding and is agreeable with plan to dc.      Jerre SimonJessica L Janita Camberos, PA 07/24/15 2302  Linwood DibblesJon Knapp, MD 07/24/15 2302

## 2015-07-24 NOTE — Discharge Instructions (Signed)
Follow up with your primary care provider within 2 days regarding your headache and elevated blood pressure. Be sure to take your blood pressure medication as prescribed. Be sure to drink plenty of water, at least eight 8oz glasses of water each day.   Return to the Emergency department if you experience worsening headache with associated neck pain or stiffness, uncontrolled headache with OTC medication, nausea, vomiting, fever, chills, or visual changes.   Migraine Headache A migraine headache is an intense, throbbing pain on one or both sides of your head. A migraine can last for 30 minutes to several hours. CAUSES  The exact cause of a migraine headache is not always known. However, a migraine may be caused when nerves in the brain become irritated and release chemicals that cause inflammation. This causes pain. Certain things may also trigger migraines, such as:  Alcohol.  Smoking.  Stress.  Menstruation.  Aged cheeses.  Foods or drinks that contain nitrates, glutamate, aspartame, or tyramine.  Lack of sleep.  Chocolate.  Caffeine.  Hunger.  Physical exertion.  Fatigue.  Medicines used to treat chest pain (nitroglycerine), birth control pills, estrogen, and some blood pressure medicines. SIGNS AND SYMPTOMS  Pain on one or both sides of your head.  Pulsating or throbbing pain.  Severe pain that prevents daily activities.  Pain that is aggravated by any physical activity.  Nausea, vomiting, or both.  Dizziness.  Pain with exposure to bright lights, loud noises, or activity.  General sensitivity to bright lights, loud noises, or smells. Before you get a migraine, you may get warning signs that a migraine is coming (aura). An aura may include:  Seeing flashing lights.  Seeing bright spots, halos, or zigzag lines.  Having tunnel vision or blurred vision.  Having feelings of numbness or tingling.  Having trouble talking.  Having muscle  weakness. DIAGNOSIS  A migraine headache is often diagnosed based on:  Symptoms.  Physical exam.  A CT scan or MRI of your head. These imaging tests cannot diagnose migraines, but they can help rule out other causes of headaches. TREATMENT Medicines may be given for pain and nausea. Medicines can also be given to help prevent recurrent migraines.  HOME CARE INSTRUCTIONS  Only take over-the-counter or prescription medicines for pain or discomfort as directed by your health care provider. The use of long-term narcotics is not recommended.  Lie down in a dark, quiet room when you have a migraine.  Keep a journal to find out what may trigger your migraine headaches. For example, write down:  What you eat and drink.  How much sleep you get.  Any change to your diet or medicines.  Limit alcohol consumption.  Quit smoking if you smoke.  Get 7-9 hours of sleep, or as recommended by your health care provider.  Limit stress.  Keep lights dim if bright lights bother you and make your migraines worse. SEEK IMMEDIATE MEDICAL CARE IF:   Your migraine becomes severe.  You have a fever.  You have a stiff neck.  You have vision loss.  You have muscular weakness or loss of muscle control.  You start losing your balance or have trouble walking.  You feel faint or pass out.  You have severe symptoms that are different from your first symptoms. MAKE SURE YOU:   Understand these instructions.  Will watch your condition.  Will get help right away if you are not doing well or get worse.   This information is not intended to replace  advice given to you by your health care provider. Make sure you discuss any questions you have with your health care provider.   Document Released: 01/09/2005 Document Revised: 01/30/2014 Document Reviewed: 09/16/2012 Elsevier Interactive Patient Education [employer].

## 2015-07-24 NOTE — ED Notes (Signed)
Patient complaining of a headache. Patient vomited at home. Patient is sensitive to light. Patient does not take blood pressure medications on a regular.

## 2015-07-24 NOTE — ED Notes (Addendum)
[facility] with complaints of hypertension and headache. Pt states that she takes medications for her BP, but does not take them regularly. Pt states a migraine woke her up from her sleep this evening. Pt has sensitivity to light and had 1 episode of emesis.  Pt states she has taken 2 tylenols for her headache with minimal relief. Pt has pain in the frontal region of her head that she describes as throbbing. Pt has an unremarkable neuro exam

## 2015-09-29 ENCOUNTER — Other Ambulatory Visit: Payer: Self-pay | Admitting: *Deleted

## 2015-09-29 MED ORDER — METOPROLOL TARTRATE 25 MG PO TABS: 25.0000 mg | ORAL_TABLET | Freq: Two times a day (BID) | ORAL | 0 refills | Status: DC

## 2015-09-29 MED FILL — METOPROLOL TARTRATE 25 MG T: 25 | 90 days supply | Qty: 180 | Fill #0

## 2015-12-23 ENCOUNTER — Ambulatory Visit: Payer: 59 | Admitting: Interventional Cardiology

## 2015-12-28 ENCOUNTER — Other Ambulatory Visit: Payer: Self-pay | Admitting: *Deleted

## 2015-12-28 MED ORDER — METOPROLOL TARTRATE 25 MG PO TABS: 25.0000 mg | ORAL_TABLET | Freq: Two times a day (BID) | ORAL | 0 refills | Status: DC

## 2015-12-28 MED FILL — METOPROLOL TARTRATE 25 MG T: 25 | 30 days supply | Qty: 60 | Fill #0

## 2016-01-03 ENCOUNTER — Ambulatory Visit (INDEPENDENT_AMBULATORY_CARE_PROVIDER_SITE_OTHER): Payer: 59 | Admitting: Physician Assistant

## 2016-01-03 ENCOUNTER — Encounter (INDEPENDENT_AMBULATORY_CARE_PROVIDER_SITE_OTHER): Payer: Self-pay

## 2016-01-03 ENCOUNTER — Encounter: Payer: Self-pay | Admitting: Physician Assistant

## 2016-01-03 VITALS — BP 126/84 | HR 64 | Ht 63.0 in | Wt 169.0 lb

## 2016-01-03 DIAGNOSIS — R079 Chest pain, unspecified: Secondary | ICD-10-CM

## 2016-01-03 DIAGNOSIS — I471 Supraventricular tachycardia: Secondary | ICD-10-CM | POA: Diagnosis not present

## 2016-01-03 DIAGNOSIS — Z8249 Family history of ischemic heart disease and other diseases of the circulatory system: Secondary | ICD-10-CM

## 2016-01-03 NOTE — Progress Notes (Signed)
Cardiology Office Note    Date:  01/03/2016   ID:  Holly [name], DOB 1975-11-09, MRN [id number]  PCP:  Cain Saupe, MD  Cardiologist: Dr. Katrinka Blazing  Chief Complaint  Patient presents with  . Tachycardia    PSVT    History of Present Illness:  Holly [name] is a 40 y.o. female history of PSVT treated with metoprolol. Last saw Dr. Katrinka Blazing 06/2014 and had arrhythmia complicating pregnancy in her third trimester but normal delivery. Blood pressure was elevated that day and he recommended decrease salt intake and monitor. He recommended continuing treatment with metoprolol but did not rule out referring for EP evaluation and possible ablation if recurrent tachycardia.  Patient comes in today for follow-up. She had episode of rapid heart rate 2 weeks ago. It occurred when she bent over and lasted about 20 minutes. It resolved spontaneously. She's had about 4 episodes in the past year. She was hoping to wean off metoprolol. She also had an episode of chest pain yesterday. She described it as an aching. Again it occurred when she bent over. She was wondering if it came from taking the metoprolol and then bending over. She did have some reflux symptoms. She has a hard time describing the chest pain. Her mother and grandmother have heart disease. Mother is 21 with CHF and grandmother had an MI in her 65s.    Past Medical History:  Diagnosis Date  . SVT (supraventricular tachycardia) (HCC)   . Tachycardia   . Vaginal Pap smear, abnormal     Past Surgical History:  Procedure Laterality Date  . LEEP      Current Medications: Outpatient Medications Prior to Visit  Medication Sig Dispense Refill  . metoprolol tartrate (LOPRESSOR) 25 MG tablet Take 1 tablet (25 mg total) by mouth 2 (two) times daily. *Patient needs to keep 01/03/16 appointment for further refills to be granted* 60 tablet 0  . acetaminophen (TYLENOL) 325 MG tablet Take 650 mg by mouth every 6 (six) hours as needed (for  pain/headache.).    Marland Kitchen Prenat-FeFum-FePo-FA-Omega 3 (TARON-C DHA) 53.5-38-1 MG CAPS Take 1 capsule by mouth daily.  11   No facility-administered medications prior to visit.      Allergies:   Amoxicillin   Social History   Social History  . Marital status: Married    Spouse name: N/A  . Number of children: 3  . Years of education: N/A   Occupational History  . surgical tech [employer]   Social History Main Topics  . Smoking status: Never Smoker  . Smokeless tobacco: Never Used  . Alcohol use No  . Drug use: No  . Sexual activity: Yes    Birth control/ protection: None   Other Topics Concern  . None   Social History Narrative   ** Merged History Encounter **         Family History:  The patient's family history includes Diabetes in her father; Heart disease in her mother; Heart failure in her maternal grandmother; Hypertension in her mother.   ROS:   Please see the history of present illness.    Review of Systems  Constitution: Negative.  HENT: Negative.   Eyes: Negative.   Cardiovascular: Positive for chest pain and palpitations.  Respiratory: Negative.   Hematologic/Lymphatic: Negative.   Musculoskeletal: Negative.  Negative for joint pain.  Gastrointestinal: Negative.   Genitourinary: Negative.   Neurological: Negative.    All other systems reviewed and are negative.  PHYSICAL EXAM:   VS:  BP 126/84   Pulse 64   Ht 5\' 3"  (1.6 m)   Wt 169 lb (76.7 kg)   LMP 11/29/2015 (Within Weeks)   BMI 29.94 kg/m   Physical Exam  GEN: Well nourished, well developed, in no acute distress  Neck: no JVD, carotid bruits, or masses Cardiac:RRR; no murmurs, rubs, or gallops  Respiratory:  clear to auscultation bilaterally, normal work of breathing GI: soft, nontender, nondistended, + BS Ext: without cyanosis, clubbing, or edema, Good distal pulses bilaterally MS: no deformity or atrophy  Psych: euthymic mood, full affect  Wt Readings from Last 3 Encounters:    01/03/16 169 lb (76.7 kg)  07/03/14 164 lb 12.8 oz (74.8 kg)  06/28/14 170 lb 12.8 oz (77.5 kg)      Studies/Labs Reviewed:   EKG:  EKG is ordered today.  The ekg ordered today demonstrates Normal sinus rhythm with right atrial enlargement, no acute change  Recent Labs: 07/24/2015: BUN 11; Creatinine, Ser 0.60; Hemoglobin 13.3; Platelets 214; Potassium 4.0; Sodium 139   Lipid Panel No results found for: CHOL, TRIG, HDL, CHOLHDL, VLDL, LDLCALC, LDLDIRECT  Additional studies/ records that were reviewed today include:     ASSESSMENT:    1. PSVT (paroxysmal supraventricular tachycardia) (HCC)   2. Chest pain, unspecified type   3. Family history of early CAD      PLAN:  In order of problems listed above:  PSVT mostly controlled with metoprolol. Told her she could take an extra metoprolol as needed for palpitations. Decrease caffeine intake. Don't recommend weaning from metoprolol this time. If she has increase in arrhythmias refer to EP for possible ablation. Follow-up with Dr. Katrinka Blazing in 6 months or sooner if needed.  Chest pain somewhat atypical but family history of early CAD. Will schedule GXT.    Medication Adjustments/Labs and Tests Ordered: Current medicines are reviewed at length with the patient today.  Concerns regarding medicines are outlined above.  Medication changes, Labs and Tests ordered today are listed in the Patient Instructions below. Patient Instructions  Medication Instructions:  Your physician recommends that you continue on your current medications as directed. Please refer to the Current Medication list given to you today.   Labwork: None ordered  Testing/Procedures: Your physician has requested that you have an exercise tolerance test. For further information please visit [email or web address] Please also follow instruction sheet, as given.   Follow-Up: Your physician wants you to follow-up in:  6 MONTHS WITH DR. Marlou Starks will receive a  reminder letter in the mail two months in advance. If you don't receive a letter, please call our office to schedule the follow-up appointment.  Any Other Special Instructions Will Be Listed Below (If Applicable).   Exercise Stress Electrocardiogram An exercise stress electrocardiogram is a test that is done to evaluate the blood supply to your heart. This test may also be called exercise stress electrocardiography. The test is done while you are walking on a treadmill. The goal of this test is to raise your heart rate. This test is done to find areas of poor blood flow to the heart by determining the extent of coronary artery disease (CAD). CAD is defined as narrowing in one or more heart (coronary) arteries of more than 70%. If you have an abnormal test result, this may mean that you are not getting adequate blood flow to your heart during exercise. Additional testing may be needed to understand why your test was abnormal. Tell a  health care provider about:  Any allergies you have.  All medicines you are taking, including vitamins, herbs, eye drops, creams, and over-the-counter medicines.  Any problems you or family members have had with anesthetic medicines.  Any blood disorders you have.  Any surgeries you have had.  Any medical conditions you have.  Possibility of pregnancy, if this applies. What are the risks? Generally, this is a safe procedure. However, as with any procedure, complications can occur. Possible complications can include:  Pain or pressure in the following areas:  Chest.  Jaw or neck.  Between your shoulder blades.  Radiating down your left arm.  Dizziness or light-headedness.  Shortness of breath.  Increased or irregular heartbeats.  Nausea or vomiting.  Heart attack (rare). What happens before the procedure?  Avoid all forms of caffeine 24 hours before your test or as directed by your health care provider. This includes coffee, tea (even  decaffeinated tea), caffeinated sodas, chocolate, cocoa, and certain pain medicines.  Follow your health care provider's instructions regarding eating and drinking before the test.  Take your medicines as directed at regular times with water unless instructed otherwise. Exceptions may include:  If you have diabetes, ask how you are to take your insulin or pills. It is common to adjust insulin dosing the morning of the test.  If you are taking beta-blocker medicines, it is important to talk to your health care provider about these medicines well before the date of your test. Taking beta-blocker medicines may interfere with the test. In some cases, these medicines need to be changed or stopped 24 hours or more before the test.  If you wear a nitroglycerin patch, it may need to be removed prior to the test. Ask your health care provider if the patch should be removed before the test.  If you use an inhaler for any breathing condition, bring it with you to the test.  If you are an outpatient, bring a snack so you can eat right after the stress phase of the test.  Do not smoke for 4 hours prior to the test or as directed by your health care provider.  Do not apply lotions, powders, creams, or oils on your chest prior to the test.  Wear loose-fitting clothes and comfortable shoes for the test. This test involves walking on a treadmill. What happens during the procedure?  Multiple patches (electrodes) will be put on your chest. If needed, small areas of your chest may have to be shaved to get better contact with the electrodes. Once the electrodes are attached to your body, multiple wires will be attached to the electrodes and your heart rate will be monitored.  Your heart will be monitored both at rest and while exercising.  You will walk on a treadmill. The treadmill will be started at a slow pace. The treadmill speed and incline will gradually be increased to raise your heart rate. What  happens after the procedure?  Your heart rate and blood pressure will be monitored after the test.  You may return to your normal schedule including diet, activities, and medicines, unless your health care provider tells you otherwise. This information is not intended to replace advice given to you by your health care provider. Make sure you discuss any questions you have with your health care provider. Document Released: 01/07/2000 Document Revised: 06/17/2015 Document Reviewed: 09/16/2012 Elsevier Interactive Patient Education  2017 [employer]   If you need a refill on your cardiac medications before your next  appointment, please call your pharmacy.      Elson ClanSigned, Michele Lenze, PA-C  01/03/2016 12:42 PM    [facility] [address], [place], [place]  [address] Phone: [phone]; Fax: [phone]

## 2016-01-03 NOTE — Patient Instructions (Addendum)
Medication Instructions:  Your physician recommends that you continue on your current medications as directed. Please refer to the Current Medication list given to you today.   Labwork: None ordered  Testing/Procedures: Your physician has requested that you have an exercise tolerance test. For further information please visit [email or web address] Please also follow instruction sheet, as given.   Follow-Up: Your physician wants you to follow-up in:  6 MONTHS WITH DR. Marlou StarksSMITH  You will receive a reminder letter in the mail two months in advance. If you don't receive a letter, please call our office to schedule the follow-up appointment.  Any Other Special Instructions Will Be Listed Below (If Applicable).   Exercise Stress Electrocardiogram An exercise stress electrocardiogram is a test that is done to evaluate the blood supply to your heart. This test may also be called exercise stress electrocardiography. The test is done while you are walking on a treadmill. The goal of this test is to raise your heart rate. This test is done to find areas of poor blood flow to the heart by determining the extent of coronary artery disease (CAD). CAD is defined as narrowing in one or more heart (coronary) arteries of more than 70%. If you have an abnormal test result, this may mean that you are not getting adequate blood flow to your heart during exercise. Additional testing may be needed to understand why your test was abnormal. Tell a health care provider about:  Any allergies you have.  All medicines you are taking, including vitamins, herbs, eye drops, creams, and over-the-counter medicines.  Any problems you or family members have had with anesthetic medicines.  Any blood disorders you have.  Any surgeries you have had.  Any medical conditions you have.  Possibility of pregnancy, if this applies. What are the risks? Generally, this is a safe procedure. However, as with any procedure,  complications can occur. Possible complications can include:  Pain or pressure in the following areas:  Chest.  Jaw or neck.  Between your shoulder blades.  Radiating down your left arm.  Dizziness or light-headedness.  Shortness of breath.  Increased or irregular heartbeats.  Nausea or vomiting.  Heart attack (rare). What happens before the procedure?  Avoid all forms of caffeine 24 hours before your test or as directed by your health care provider. This includes coffee, tea (even decaffeinated tea), caffeinated sodas, chocolate, cocoa, and certain pain medicines.  Follow your health care provider's instructions regarding eating and drinking before the test.  Take your medicines as directed at regular times with water unless instructed otherwise. Exceptions may include:  If you have diabetes, ask how you are to take your insulin or pills. It is common to adjust insulin dosing the morning of the test.  If you are taking beta-blocker medicines, it is important to talk to your health care provider about these medicines well before the date of your test. Taking beta-blocker medicines may interfere with the test. In some cases, these medicines need to be changed or stopped 24 hours or more before the test.  If you wear a nitroglycerin patch, it may need to be removed prior to the test. Ask your health care provider if the patch should be removed before the test.  If you use an inhaler for any breathing condition, bring it with you to the test.  If you are an outpatient, bring a snack so you can eat right after the stress phase of the test.  Do not smoke for 4 hours prior  to the test or as directed by your health care provider.  Do not apply lotions, powders, creams, or oils on your chest prior to the test.  Wear loose-fitting clothes and comfortable shoes for the test. This test involves walking on a treadmill. What happens during the procedure?  Multiple patches  (electrodes) will be put on your chest. If needed, small areas of your chest may have to be shaved to get better contact with the electrodes. Once the electrodes are attached to your body, multiple wires will be attached to the electrodes and your heart rate will be monitored.  Your heart will be monitored both at rest and while exercising.  You will walk on a treadmill. The treadmill will be started at a slow pace. The treadmill speed and incline will gradually be increased to raise your heart rate. What happens after the procedure?  Your heart rate and blood pressure will be monitored after the test.  You may return to your normal schedule including diet, activities, and medicines, unless your health care provider tells you otherwise. This information is not intended to replace advice given to you by your health care provider. Make sure you discuss any questions you have with your health care provider. Document Released: 01/07/2000 Document Revised: 06/17/2015 Document Reviewed: 09/16/2012 Elsevier Interactive Patient Education  2017 [employer]   If you need a refill on your cardiac medications before your next appointment, please call your pharmacy.

## 2016-01-06 ENCOUNTER — Encounter: Payer: Self-pay | Admitting: Physician Assistant

## 2016-01-12 MED FILL — VALACYCLOVIR HCL 500 MG TAB: 500 | 2 days supply | Qty: 6 | Fill #0

## 2016-01-18 ENCOUNTER — Telehealth: Payer: Self-pay | Admitting: Interventional Cardiology

## 2016-01-18 ENCOUNTER — Ambulatory Visit (INDEPENDENT_AMBULATORY_CARE_PROVIDER_SITE_OTHER): Payer: 59

## 2016-01-18 DIAGNOSIS — R079 Chest pain, unspecified: Secondary | ICD-10-CM

## 2016-01-18 LAB — EXERCISE TOLERANCE TEST
Estimated workload: 10.1 METS
Exercise duration (min): 9 min
Exercise duration (sec): 0 s
MPHR: 182 {beats}/min
Peak HR: 162 {beats}/min
Percent HR: 89 %
RPE: 17
Rest HR: 65 {beats}/min

## 2016-01-18 NOTE — Telephone Encounter (Signed)
Returned pts call and made her aware that her stress test was normal. Pt was very appreciative.

## 2016-01-18 NOTE — Telephone Encounter (Signed)
Holly [name] is returning call. Please contact at [phone]. Thanks.

## 2016-01-18 NOTE — Telephone Encounter (Signed)
Follow up ° ° °Pt verbalized that she is returning call for rn °

## 2016-01-28 ENCOUNTER — Other Ambulatory Visit: Payer: Self-pay | Admitting: Interventional Cardiology

## 2016-01-28 MED FILL — METOPROLOL TARTRATE 25 MG T: 25 | 90 days supply | Qty: 180 | Fill #0

## 2016-02-18 ENCOUNTER — Other Ambulatory Visit (HOSPITAL_COMMUNITY)
Admission: RE | Admit: 2016-02-18 | Discharge: 2016-02-18 | Disposition: A | Discharge status: Home / Self Care | Payer: 59 | Source: Ambulatory Visit | Attending: Obstetrics and Gynecology | Admitting: Obstetrics and Gynecology

## 2016-02-18 ENCOUNTER — Other Ambulatory Visit: Payer: Self-pay | Admitting: Obstetrics and Gynecology

## 2016-02-18 DIAGNOSIS — A6004 Herpesviral vulvovaginitis: Secondary | ICD-10-CM | POA: Diagnosis not present

## 2016-02-18 DIAGNOSIS — Z30014 Encounter for initial prescription of intrauterine contraceptive device: Secondary | ICD-10-CM | POA: Diagnosis not present

## 2016-02-18 DIAGNOSIS — N926 Irregular menstruation, unspecified: Secondary | ICD-10-CM | POA: Diagnosis not present

## 2016-02-18 DIAGNOSIS — Z01419 Encounter for gynecological examination (general) (routine) without abnormal findings: Secondary | ICD-10-CM | POA: Diagnosis not present

## 2016-02-18 DIAGNOSIS — N63 Unspecified lump in unspecified breast: Secondary | ICD-10-CM | POA: Diagnosis not present

## 2016-02-18 DIAGNOSIS — Z1151 Encounter for screening for human papillomavirus (HPV): Secondary | ICD-10-CM | POA: Diagnosis not present

## 2016-02-18 DIAGNOSIS — Z01411 Encounter for gynecological examination (general) (routine) with abnormal findings: Secondary | ICD-10-CM | POA: Diagnosis not present

## 2016-02-22 LAB — CYTOLOGY - PAP
Diagnosis: NEGATIVE
HPV: NOT DETECTED

## 2016-05-09 MED FILL — METOPROLOL TARTRATE 25 MG T: 25 | 90 days supply | Qty: 180 | Fill #1

## 2016-05-21 IMAGING — US US OB DETAIL+14 WK
1 series · 12 of 28 positions shown · non-contrast
Comparison: none

[Series 1: us ob detail+14 wk · 0.18mm/px · 97 acquisitions, 12 frames shown]
[im 4/97]
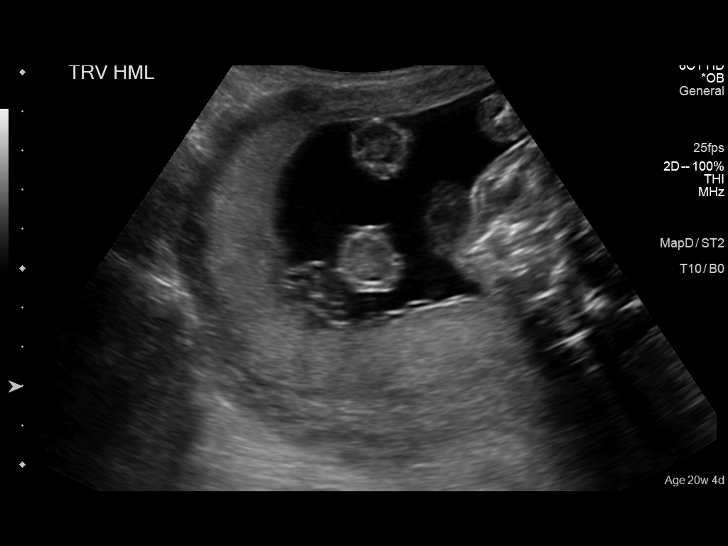
[im 11/97]
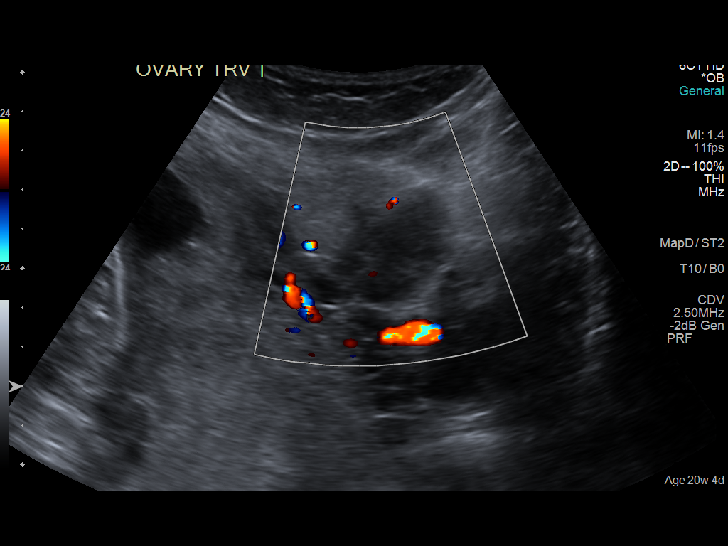
[im 18/97]
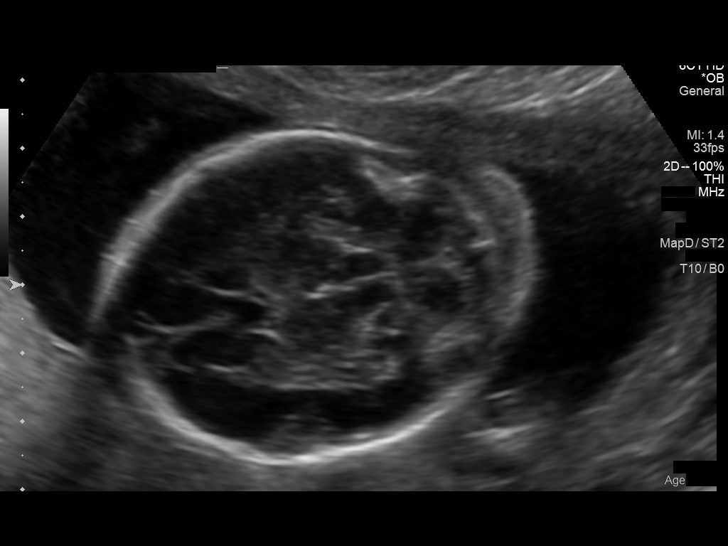
[im 29/97]
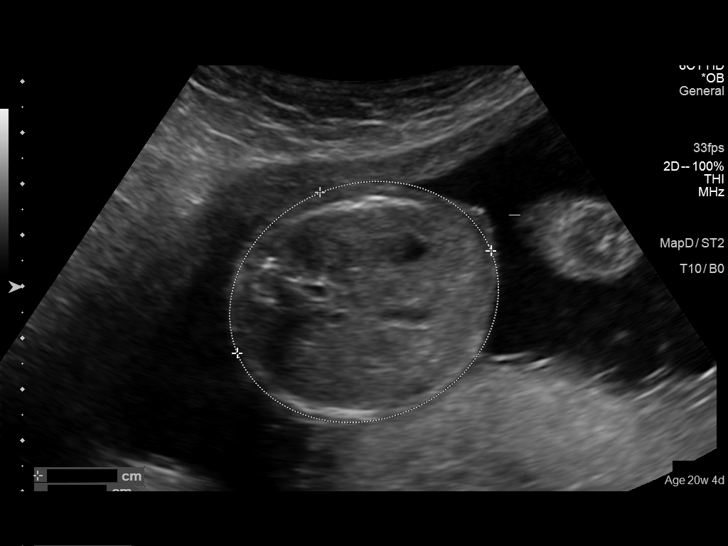
[im 36/97]
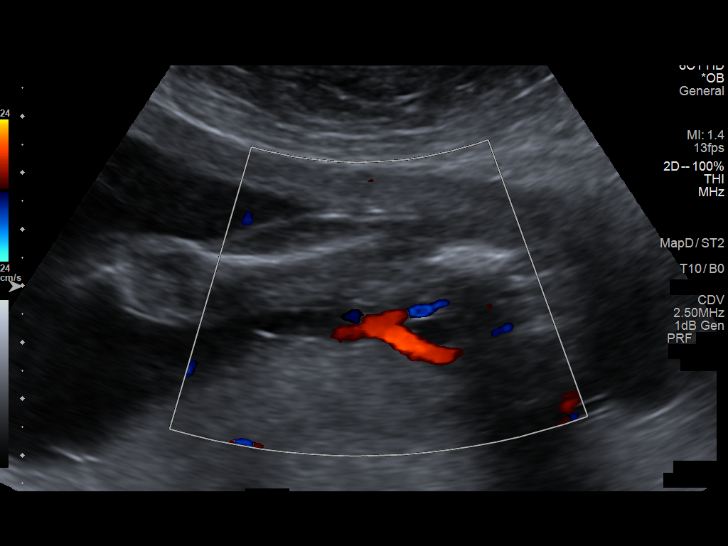
[im 43/97]
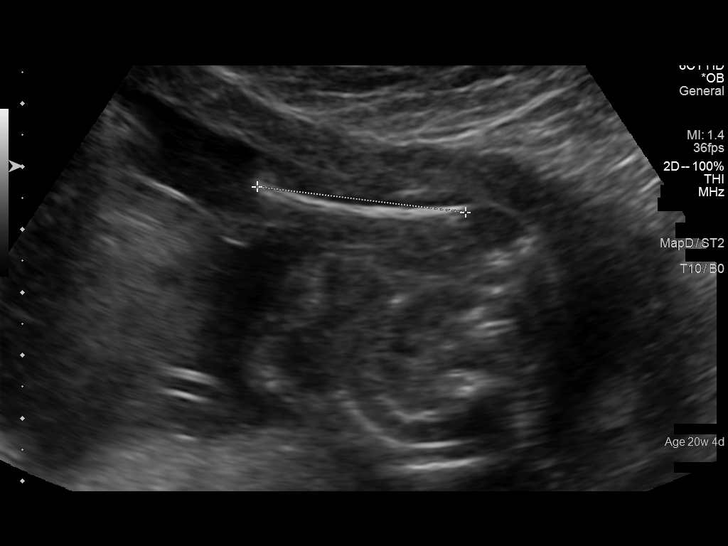
[im 54/97]
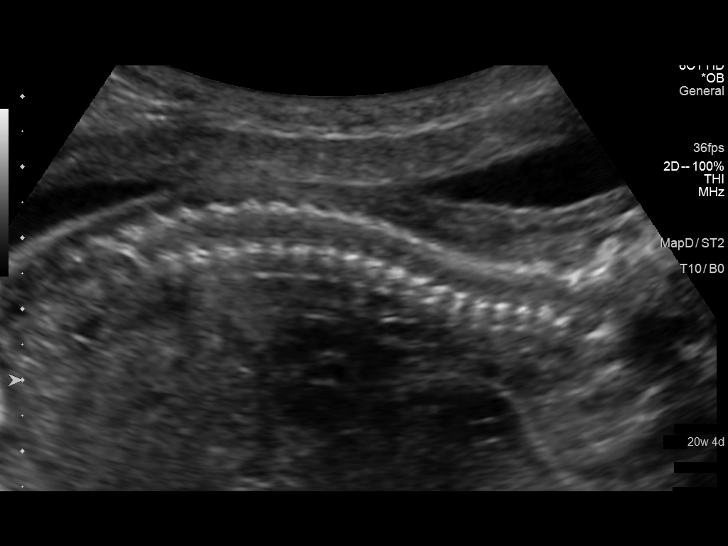
[im 61/97]
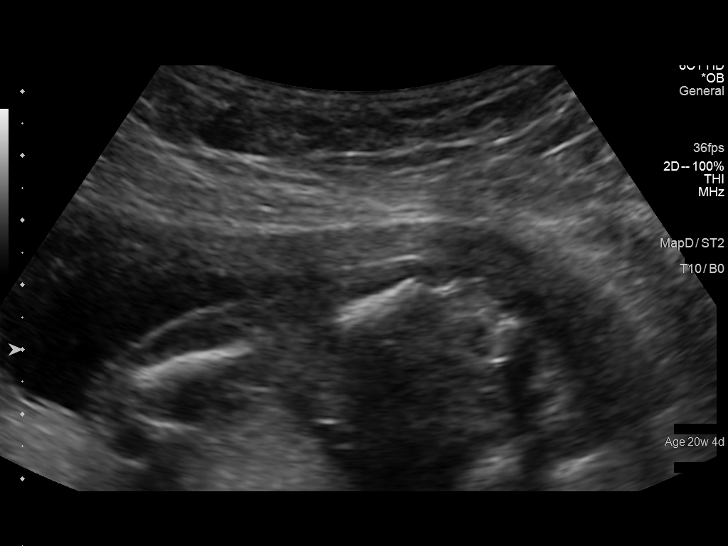
[im 68/97]
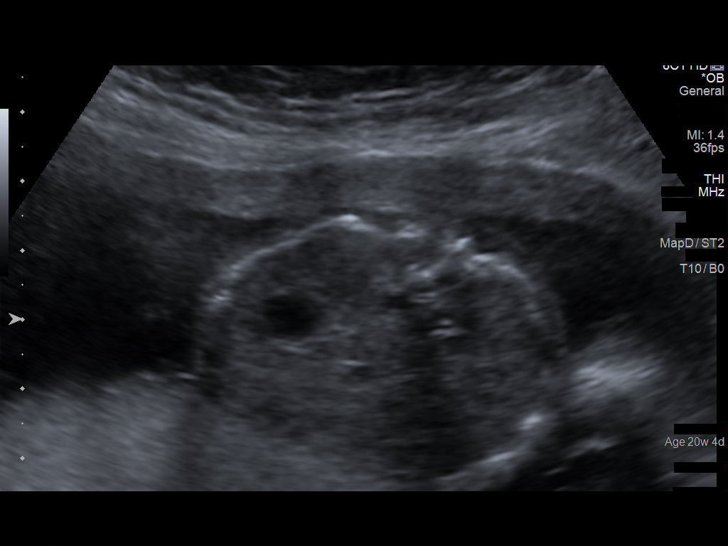
[im 79/97]
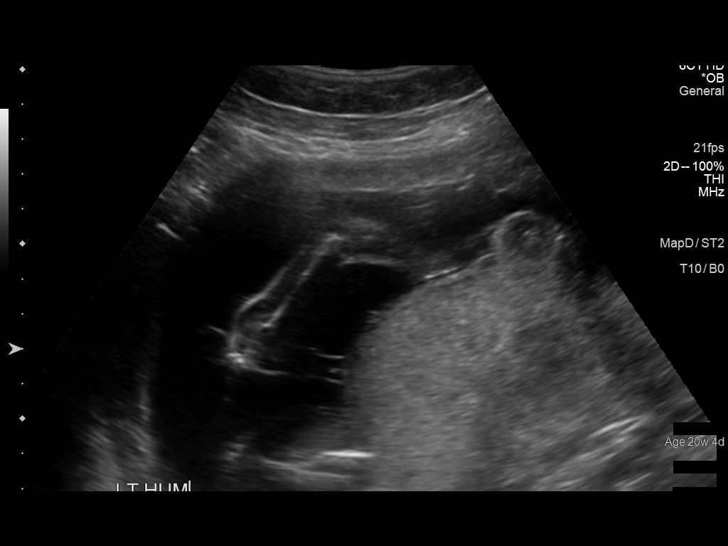
[im 86/97]
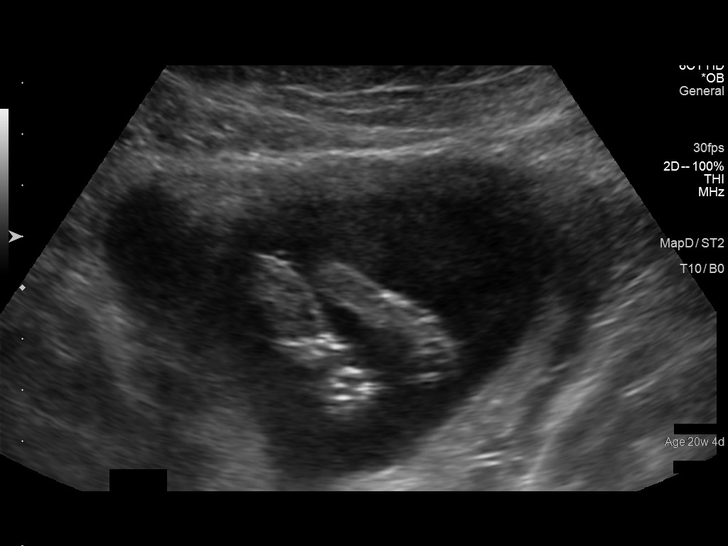
[im 93/97]
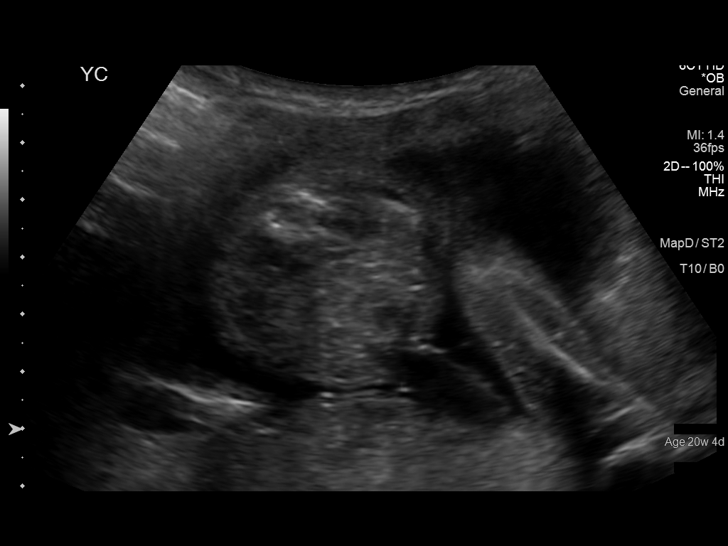

[12 of 28 positions shown; findings below may reference images not displayed]

OBSTETRICS REPORT
                      (Signed Final 02/04/2014 [DATE])

Service(s) Provided

 US OB DETAIL + 14 WK                                  76811.0
Indications

 Advanced maternal age multigravida 35+, second
 trimester
 20 weeks gestation of pregnancy
Fetal Evaluation

 Num Of Fetuses:    1
 Fetal Heart Rate:  160                          bpm
 Cardiac Activity:  Observed
 Presentation:      Transverse, head to
                    maternal left
 Placenta:          Posterior, above cervical
                    os
 P. Cord            Visualized, central
 Insertion:

 Amniotic Fluid
 AFI FV:      Subjectively within normal limits
                                             Larg Pckt:     5.7  cm
Biometry

 BPD:     47.1  mm     G. Age:  20w 2d                CI:         73.1   70 - 86
                                                      FL/HC:      20.2   15.9 -

 HC:     175.1  mm     G. Age:  20w 0d       20  %    HC/AC:      1.11   1.06 -

 AC:     157.7  mm     G. Age:  20w 6d       55  %    FL/BPD:
 FL:      35.4  mm     G. Age:  21w 1d       63  %    FL/AC:      22.4   20 - 24
 HUM:     33.3  mm     G. Age:  21w 2d       68  %
 CER:     19.7  mm     G. Age:  18w 6d        8  %

 Est. FW:     386  gm    0 lb 14 oz      52  %
Gestational Age

 Clinical EDD:  20w 4d                                        EDD:   06/20/14
 U/S Today:     20w 4d                                        EDD:   06/20/14
 Best:          20w 4d     Det. By:  Clinical EDD             EDD:   06/20/14
Anatomy

 Cranium:          Appears normal         Aortic Arch:      Not well visualized
 Fetal Cavum:      Appears normal         Ductal Arch:      Not well visualized
 Ventricles:       Appears normal         Diaphragm:        Appears normal
 Choroid Plexus:   Appears normal         Stomach:          Appears normal, left
                                                            sided
 Cerebellum:       Appears normal         Abdomen:          Appears normal
 Posterior Fossa:  Appears normal         Abdominal Wall:   Not well visualized
 Nuchal Fold:      Not applicable (>20    Cord Vessels:     Appears normal (3
                   wks GA)                                  vessel cord)
 Face:             Orbits appear          Kidneys:          Appear normal
                   normal
 Lips:             Not well visualized    Bladder:          Appears normal
 Heart:            Appears normal         Spine:            Appears normal
                   (4CH, axis, and
                   situs)
 RVOT:             Not well visualized    Lower             Appears normal
                                          Extremities:
 LVOT:             Not well visualized    Upper             Appears normal
                                          Extremities:

 Other:  Fetus appears to be a female. Nasal bone visualized. Technically
         difficult due to fetal position.
Cervix Uterus Adnexa

 Cervical Length:    4.1      cm

 Cervix:       Normal appearance by transabdominal scan.
 Uterus:       No abnormality visualized.
 Left Ovary:    Within normal limits.
 Right Ovary:   Within normal limits.

 Adnexa:     No adnexal mass visualized.
Impression

 Single IUP at 20w 4d
 Advanced maternal age
 Limited views of the fetal heart and face (lips) obtained
 The remainder of the fetal anatomy appears normal
 Posterior placenta without previa
 Normal amniotic fluid volume
Recommendations

 Would offer genetic counseling due to advanced maternal age
 Recommend follow-up ultrasound examination in 4 weeks to
 complete anatomy

## 2016-08-16 MED FILL — METOPROLOL TARTRATE 25 MG T: 25 | 90 days supply | Qty: 180 | Fill #2

## 2016-08-28 IMAGING — US US OB FOLLOW-UP
1 series · 12 of 28 positions shown · non-contrast
Comparison: none

[Series 1: us ob follow-up · 12 of 34 slices shown]
[im 2/34]
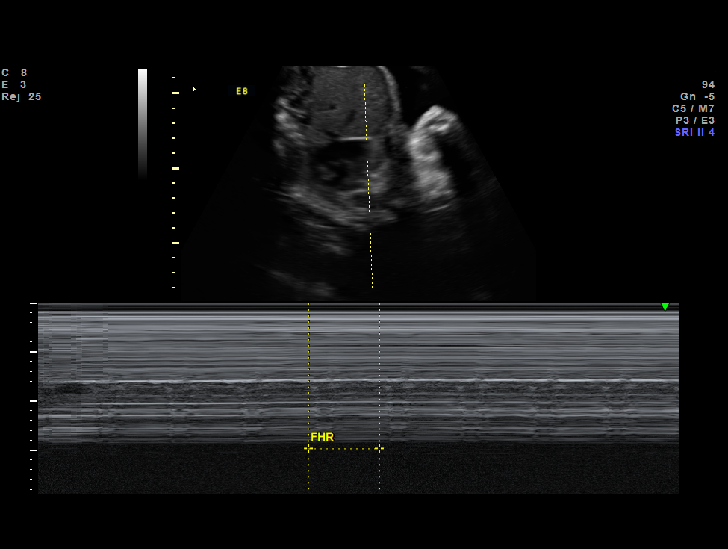
[im 4/34]
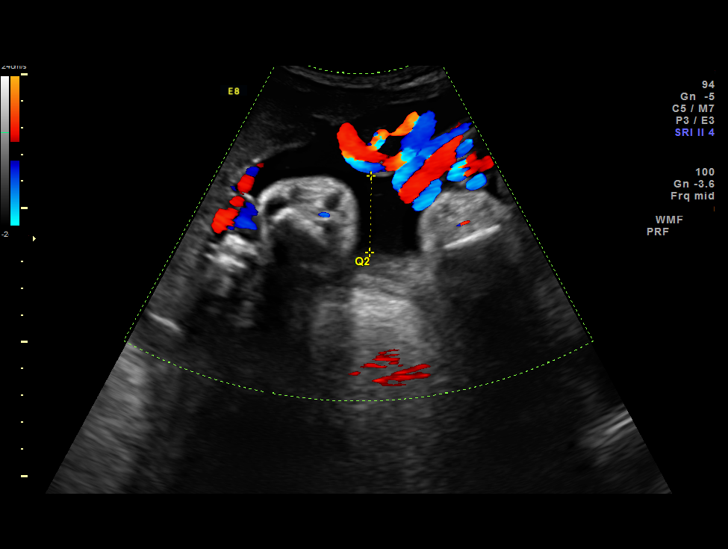
[im 7/34]
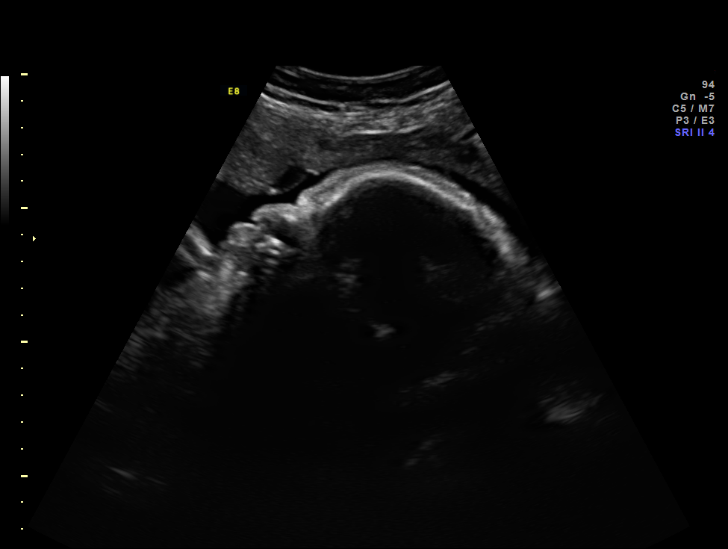
[im 10/34]
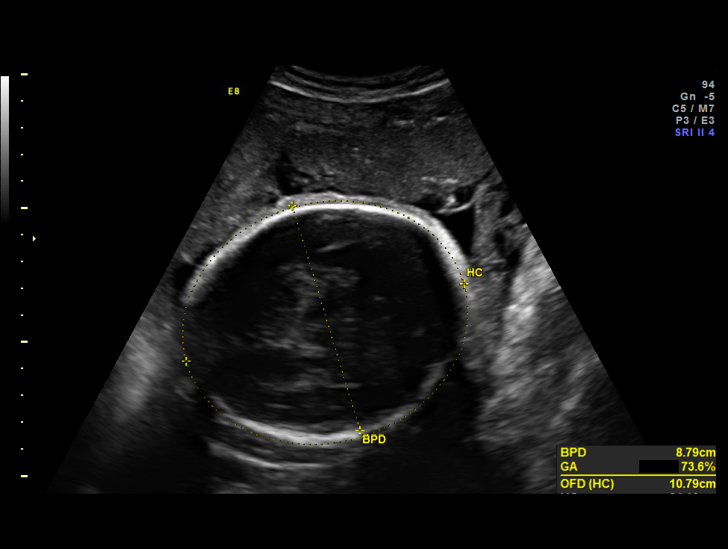
[im 13/34]
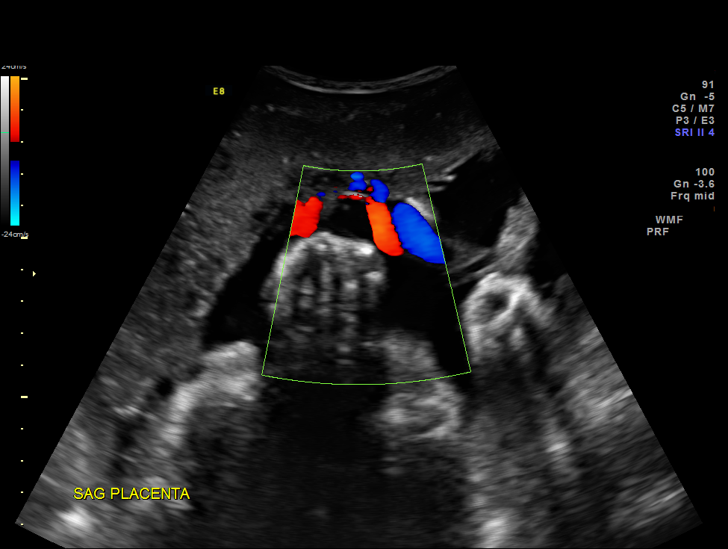
[im 15/34]
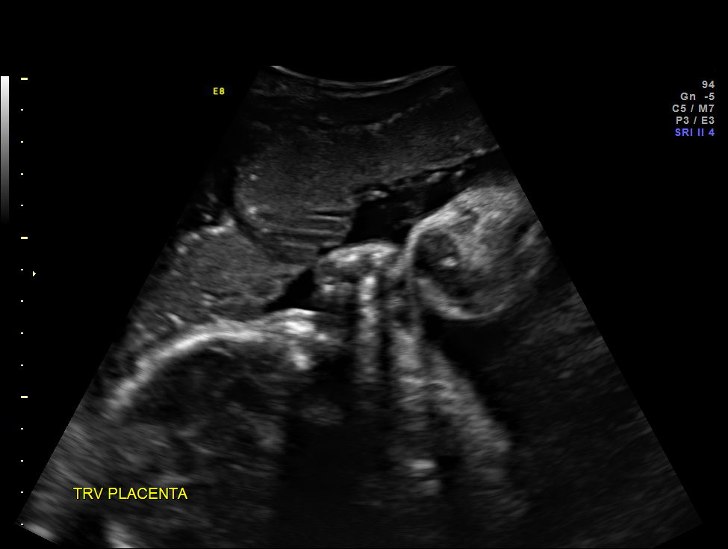
[im 19/34]
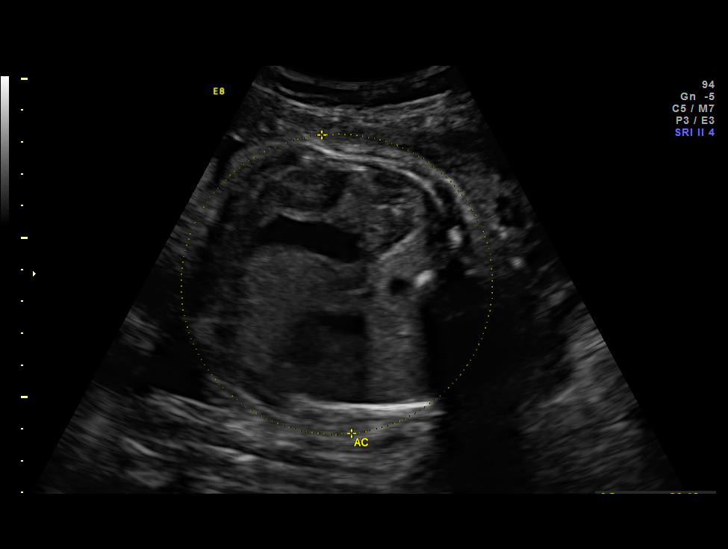
[im 21/34]
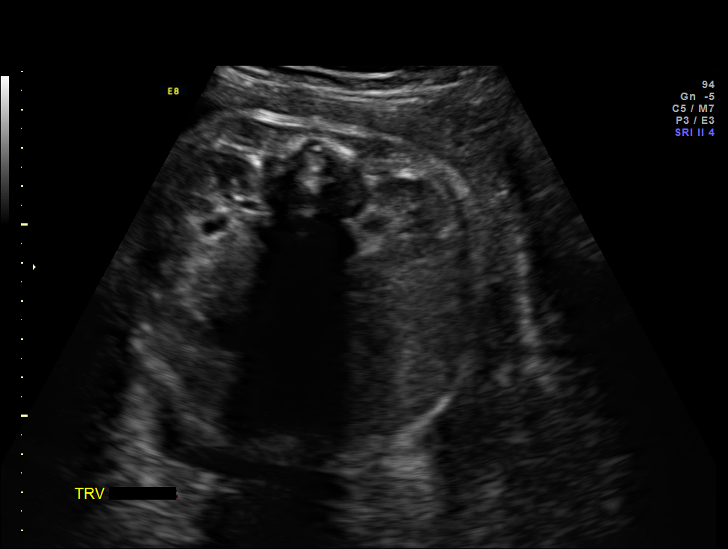
[im 24/34]
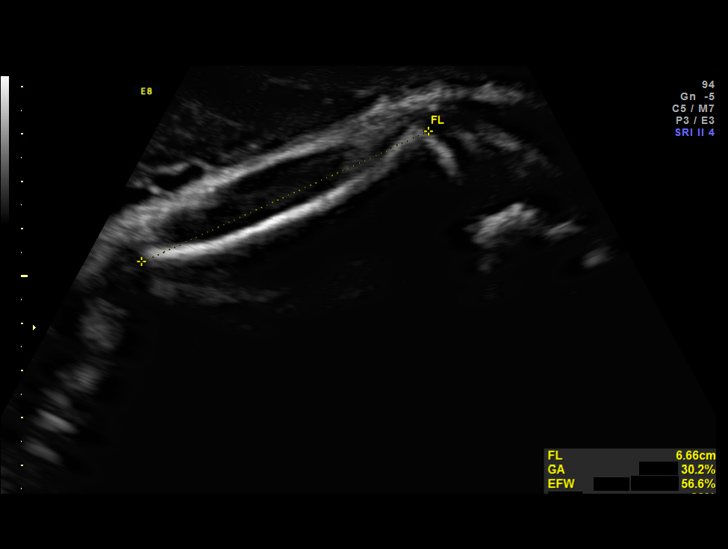
[im 27/34]
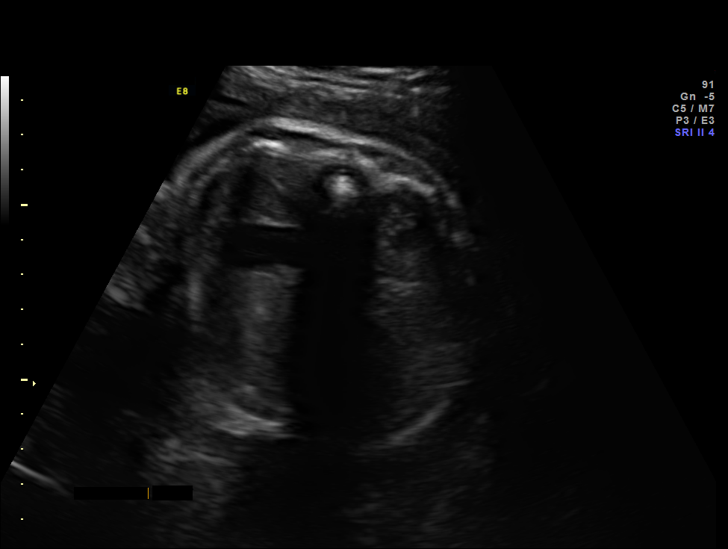
[im 30/34]
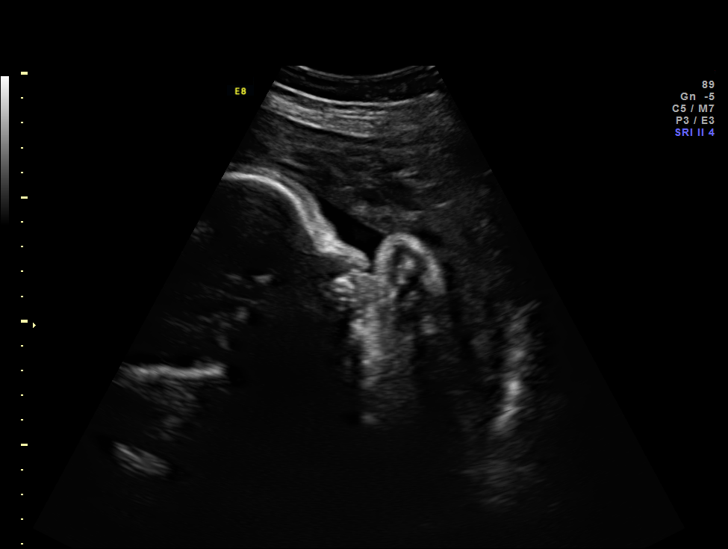
[im 32/34]
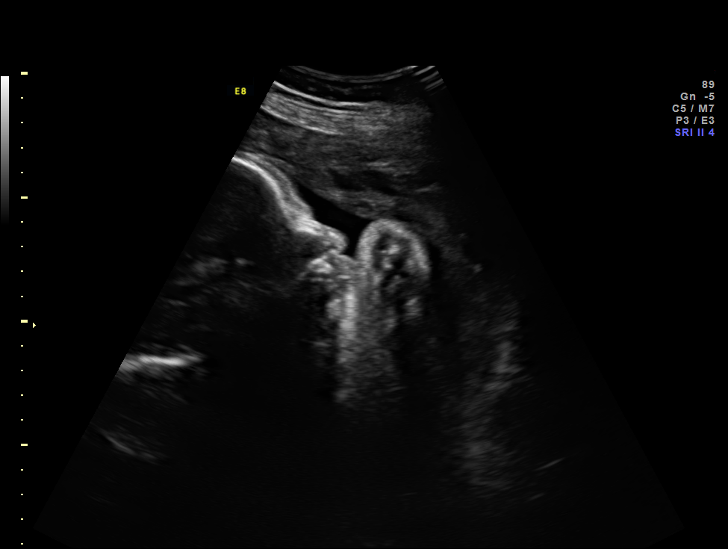

[12 of 28 positions shown; findings below may reference images not displayed]

OBSTETRICS REPORT
(Signed Final 05/14/2014 [DATE])

Service(s) Provided

US OB FOLLOW UP                                       76816.1
Indications

34 weeks gestation of pregnancy
Advanced maternal age multigravida 36, third
trimester
Fetal abnormality - other known or suspected (
EIF, Right renal pyelectasis)-low risk NIPS
Maternal arrhythmia affecting pregnancy, third        O99.413,
trimester
Fetal Evaluation

Num Of Fetuses:    1
Fetal Heart Rate:  142                          bpm
Cardiac Activity:  Observed
Presentation:      Cephalic
Placenta:          Posterior, above cervical
os
P. Cord            Visualized, central
Insertion:

Amniotic Fluid
AFI FV:      Subjectively within normal limits
AFI Sum:     12.99   cm       42  %Tile     Larg Pckt:    4.62  cm
RUQ:   3.49    cm   RLQ:    4.62   cm    LUQ:   2.87    cm   LLQ:    2.01   cm
Biometry

BPD:     86.9  mm     G. Age:  35w 0d                CI:         78.1   70 - 86
OFD:    111.2  mm                                    FL/HC:      20.6   20.1 -
22.3
HC:     315.2  mm     G. Age:  35w 2d       32  %    HC/AC:      1.03   0.93 -
1.11
AC:     304.6  mm     G. Age:  34w 3d       47  %    FL/BPD:     74.6   71 - 87
FL:      64.8  mm     G. Age:  33w 3d       14  %    FL/AC:      21.3   20 - 24

Est. FW:    2786  gm      5 lb 5 oz     52  %
Gestational Age

Clinical EDD:  34w 5d                                        EDD:   06/20/14
U/S Today:     34w 4d                                        EDD:   06/21/14
Best:          34w 5d     Det. By:  Clinical EDD             EDD:   06/20/14
Anatomy

Cranium:          Appears normal         Aortic Arch:      Previously seen
Fetal Cavum:      Previously seen        Ductal Arch:      Previously seen
Ventricles:       Appears normal         Diaphragm:        Appears normal
Choroid Plexus:   Previously seen        Stomach:          Appears normal, left
sided
Cerebellum:       Previously seen        Abdomen:          Previously seen
Posterior Fossa:  Previously seen        Abdominal Wall:   Previously seen
Nuchal Fold:      Not applicable (>20    Cord Vessels:     Previously seen
wks GA)
Face:             Orbits and profile     Kidneys:          Appear normal
previously seen
Lips:             Previously seen        Bladder:          Appears normal
Palate:           Previously seen        Spine:            Previously seen
Heart:            Previously seen        Lower             Previously seen
Extremities:
RVOT:             Previously seen        Upper             Previously seen
Extremities:
LVOT:             Previously seen

Other:  Female gender.
Cervix Uterus Adnexa

Cervix:       Not visualized (advanced GA >47wks)
Impression

Single IUP at 34w 5d
Normal interval anatomy
The previously noted urinary tract dilation (pylectasis)
appears to have resolved
Fetal growth is appropriate (52nd %tile)
Normal amniotic fluid volume
Recommendations

Follow-up ultrasounds as clinically indicated.

## 2017-03-12 ENCOUNTER — Encounter: Payer: Self-pay | Admitting: Family

## 2017-03-12 ENCOUNTER — Ambulatory Visit: Payer: Self-pay | Admitting: Family

## 2017-03-12 VITALS — BP 110/70 | HR 74 | Temp 98.0°F | Resp 16 | Wt 177.4 lb

## 2017-03-12 DIAGNOSIS — R6889 Other general symptoms and signs: Secondary | ICD-10-CM

## 2017-03-12 LAB — POCT INFLUENZA A/B
Influenza A, POC: NEGATIVE
Influenza B, POC: NEGATIVE

## 2017-03-12 MED ORDER — OSELTAMIVIR PHOSPHATE 75 MG PO CAPS: 75.0000 mg | ORAL_CAPSULE | Freq: Two times a day (BID) | ORAL | 0 refills | Status: DC

## 2017-03-12 MED FILL — OSELTAMIVIR PHOSPHATE 75 MG: 75 | 5 days supply | Qty: 10 | Fill #0

## 2017-03-12 NOTE — Patient Instructions (Signed)

## 2017-03-12 NOTE — Progress Notes (Signed)
   Subjective:    Patient ID: Holly BackerChristina L Carberry, female    DOB: [birth year], 42 y.o.   MRN: [id number]  Cough  This is a new problem. The current episode started yesterday. The problem has been gradually worsening. The problem occurs every few minutes. Associated symptoms include a fever, headaches, myalgias, rhinorrhea and a sore throat. Pertinent negatives include no chills, ear congestion, ear pain or nasal congestion. She has tried rest and OTC cough suppressant for the symptoms. The treatment provided mild relief. There is no history of asthma.      Review of Systems  Constitutional: Positive for fever. Negative for chills.  HENT: Positive for rhinorrhea and sore throat. Negative for ear pain.   Respiratory: Positive for cough.   Musculoskeletal: Positive for myalgias.  Neurological: Positive for headaches.  All other systems reviewed and are negative.      Objective:   Physical Exam  Constitutional: She is oriented to person, place, and time. She appears well-developed and well-nourished. No distress.  HENT:  Head: Normocephalic and atraumatic.  Right Ear: External ear normal.  Left Ear: External ear normal.  Nose: Mucosal edema and rhinorrhea present.  Mouth/Throat: Posterior oropharyngeal erythema present.  Eyes: Pupils are equal, round, and reactive to light.  Neck: Normal range of motion. Neck supple. No thyromegaly present.  Cardiovascular: Normal rate, regular rhythm, normal heart sounds and intact distal pulses.  No murmur heard. Pulmonary/Chest: Effort normal and breath sounds normal. No respiratory distress. She has no wheezes.  Abdominal: Soft. Bowel sounds are normal. She exhibits no distension. There is no tenderness.  Musculoskeletal: Normal range of motion. She exhibits no edema or tenderness.  Neurological: She is alert and oriented to person, place, and time.  Skin: Skin is warm and dry.  Psychiatric: She has a normal mood and affect. Her behavior is normal.  Judgment and thought content normal.  Vitals reviewed.     BP 110/70 (BP Location: Right Arm, Patient Position: Sitting, Cuff Size: Normal)   Pulse 74   Temp 98 F (36.7 C) (Oral)   Resp 16   Wt 177 lb 6.4 oz (80.5 kg)   SpO2 98%   BMI 31.42 kg/m      Assessment & Plan:  1. Influenza-like symptoms Rest Force fluids Tylenol or motrin Good hand hygiene discussed, cover mouth when coughing or sneezing RTO prn or if symptoms worsen or do not improve - POCT Influenza A/B - oseltamivir (TAMIFLU) 75 MG capsule; Take 1 capsule (75 mg total) by mouth 2 (two) times daily.  Dispense: 10 capsule; Refill: 0     Jannifer Rodneyhristy Chasty Randal, FNP

## 2017-03-14 ENCOUNTER — Telehealth: Payer: Self-pay

## 2017-03-14 NOTE — Telephone Encounter (Signed)
Patients called us back and states she feels better.

## 2017-03-14 NOTE — Telephone Encounter (Signed)
I left a message asking the patient to return the call. 

## 2017-03-26 ENCOUNTER — Other Ambulatory Visit: Payer: Self-pay | Admitting: Interventional Cardiology

## 2017-03-27 MED FILL — METOPROLOL TARTRATE 25 MG T: 25 | 30 days supply | Qty: 60 | Fill #0

## 2017-04-17 NOTE — Progress Notes (Deleted)
CARDIOLOGY OFFICE NOTE  Date:  04/18/2017    [name] Date of Birth: [birth year] Medical Record #[id number]  PCP:  Cain Saupe, MD (Inactive)  Cardiologist:  Katrinka Blazing  No chief complaint on file.   History of Present Illness: Holly [name] is a 42 y.o. female who presents today for a follow up visit. Seen for Dr. Katrinka Blazing.   She has a history of PSVT treated with metoprolol. Last saw Dr. Katrinka Blazing 06/2014 and had arrhythmia complicating pregnancy in her third trimester but normal delivery. Blood pressure was elevated that day and he recommended decreased salt intake and monitor. He recommended continuing treatment with metoprolol but did not rule out referring for EP evaluation and possible ablation if recurrent tachycardia.  She was last seen here in December of 2017 by Jacolyn Reedy, PA - atypical chest pain. Had hoped to wean off metoprolol but had had some rapid heart beat. Normal GXT at that time.   Comes in today. Here with   Past Medical History:  Diagnosis Date  . SVT (supraventricular tachycardia) (HCC)   . Tachycardia   . Vaginal Pap smear, abnormal     Past Surgical History:  Procedure Laterality Date  . LEEP       Medications: No outpatient medications have been marked as taking for the 04/18/17 encounter (Appointment) with Rosalio Macadamia, NP.     Allergies: Allergies  Allergen Reactions  . Amoxicillin Itching    Has patient had a PCN reaction causing immediate rash, facial/tongue/throat swelling, SOB or lightheadedness with hypotension:No Has patient had a PCN reaction causing severe rash involving mucus membranes or skin necrosis:No Has patient had a PCN reaction that required hospitalization:No Has patient had a PCN reaction occurring within the last 10 years:No If all of the above answers are "NO", then may proceed with Cephalosporin use.     Social History: The patient  reports that she has never smoked. She has never used smokeless  tobacco. She reports that she does not drink alcohol or use drugs.   Family History: The patient's family history includes Diabetes in her father; Heart disease in her mother; Heart failure in her maternal grandmother; Hypertension in her mother.   Review of Systems: Please see the history of present illness.   Otherwise, the review of systems is positive for none.   All other systems are reviewed and negative.   Physical Exam: VS:  There were no vitals taken for this visit. Marland Kitchen  BMI There is no height or weight on file to calculate BMI.  Wt Readings from Last 3 Encounters:  03/12/17 177 lb 6.4 oz (80.5 kg)  01/03/16 169 lb (76.7 kg)  07/03/14 164 lb 12.8 oz (74.8 kg)    General: Pleasant. Well developed, well nourished and in no acute distress.   HEENT: Normal.  Neck: Supple, no JVD, carotid bruits, or masses noted.  Cardiac: Regular rate and rhythm. No murmurs, rubs, or gallops. No edema.  Respiratory:  Lungs are clear to auscultation bilaterally with normal work of breathing.  GI: Soft and nontender.  MS: No deformity or atrophy. Gait and ROM intact.  Skin: Warm and dry. Color is normal.  Neuro:  Strength and sensation are intact and no gross focal deficits noted.  Psych: Alert, appropriate and with normal affect.   LABORATORY DATA:  EKG:  EKG is ordered today. This demonstrates .  Lab Results  Component Value Date   WBC 5.7 07/24/2015   HGB 13.3 07/24/2015  HCT 39.0 07/24/2015   PLT 214 07/24/2015   GLUCOSE 104 (H) 07/24/2015   ALT 54 06/28/2014   AST 36 06/28/2014   NA 139 07/24/2015   K 4.0 07/24/2015   CL 102 07/24/2015   CREATININE 0.60 07/24/2015   BUN 11 07/24/2015   CO2 25 06/28/2014   TSH 1.241 01/21/2014     BNP (last 3 results) No results for input(s): BNP in the last 8760 hours.  ProBNP (last 3 results) No results for input(s): PROBNP in the last 8760 hours.   Other Studies Reviewed Today:  GXT Study Highlights 12/2015   Blood pressure  demonstrated a normal response to exercise.  Upsloping ST segment depression ST segment depression was noted during stress in the III, II, aVF, V4, V5 and V6 leads.  Negative, adequate stress test.       Assessment/Plan:  1. PSVT - on beta blocker  2. +FH for CAD  3.   Current medicines are reviewed with the patient today.  The patient does not have concerns regarding medicines other than what has been noted above.  The following changes have been made:  See above.  Labs/ tests ordered today include:   No orders of the defined types were placed in this encounter.    Disposition:   FU with Dr. Katrinka BlazingSmith in one year.   Patient is agreeable to this plan and will call if any problems develop in the interim.   SignedNorma Fredrickson: Javonta Gronau, NP  04/18/2017 7:41 AM  [facility] [address] [place], [place]  [address] Phone: [phone] Fax: [phone]

## 2017-04-18 ENCOUNTER — Ambulatory Visit: Payer: 59 | Admitting: Nurse Practitioner

## 2017-04-18 ENCOUNTER — Ambulatory Visit: Payer: 59 | Admitting: Cardiology

## 2017-04-24 ENCOUNTER — Ambulatory Visit: Payer: Self-pay | Admitting: Emergency Medicine

## 2017-04-24 ENCOUNTER — Encounter: Payer: Self-pay | Admitting: Emergency Medicine

## 2017-04-24 VITALS — BP 138/94 | HR 78 | Temp 98.5°F | Wt 179.4 lb

## 2017-04-24 DIAGNOSIS — J069 Acute upper respiratory infection, unspecified: Secondary | ICD-10-CM

## 2017-04-24 MED ORDER — IPRATROPIUM BROMIDE 0.06 % NA SOLN: 2.0000 | Freq: Four times a day (QID) | NASAL | 0 refills | Status: DC

## 2017-04-24 MED FILL — IPRATROPIUM 0.06% SPRAY: 0.06 | 10 days supply | Qty: 15 | Fill #0

## 2017-04-24 NOTE — Progress Notes (Signed)
Subjective:     Holly BackerChristina L [name] is a 42 y.o. female who presents for evaluation of symptoms of a URI. Symptoms include congestion, coryza, nasal congestion, non productive cough and sore throat. Onset of symptoms was 1 day ago, and has been unchanged since that time. Treatment to date: none. No history of fever or myalgia      Review of Systems Pertinent items are noted in HPI.   Objective:    BP (!) 138/94   Pulse 78   Temp 98.5 F (36.9 C)   Wt 179 lb 6.4 oz (81.4 kg)   SpO2 99%   BMI 31.78 kg/m  General appearance: alert, cooperative and appears stated age Head: Normocephalic, without obvious abnormality, atraumatic Ears: normal TM's and external ear canals both ears Nose: Nares normal. Septum midline. Mucosa normal. No drainage or sinus tenderness. Throat: lips, mucosa, and tongue normal; teeth and gums normal Neck: no adenopathy Lungs: clear to auscultation bilaterally Heart: regular rate and rhythm Skin: Skin color, texture, turgor normal. No rashes or lesions   Assessment:    viral upper respiratory illness   Plan:    Discussed diagnosis and treatment of URI. Discussed the importance of avoiding unnecessary antibiotic therapy. Suggested symptomatic OTC remedies. Nasal saline spray for congestion. Follow up as needed.

## 2017-04-24 NOTE — Patient Instructions (Signed)

## 2017-05-04 ENCOUNTER — Telehealth: Payer: Self-pay

## 2017-05-09 ENCOUNTER — Other Ambulatory Visit: Payer: Self-pay | Admitting: *Deleted

## 2017-05-09 DIAGNOSIS — I471 Supraventricular tachycardia: Secondary | ICD-10-CM | POA: Diagnosis not present

## 2017-05-09 MED ORDER — METOPROLOL TARTRATE 25 MG PO TABS: 25.0000 mg | ORAL_TABLET | Freq: Two times a day (BID) | ORAL | 0 refills | Status: DC

## 2017-05-09 MED FILL — METOPROLOL TARTRATE 25 MG T: 25 | 30 days supply | Qty: 60 | Fill #0

## 2017-05-22 ENCOUNTER — Ambulatory Visit: Payer: 59 | Admitting: Nurse Practitioner

## 2017-05-31 ENCOUNTER — Other Ambulatory Visit: Payer: Self-pay | Admitting: Obstetrics and Gynecology

## 2017-05-31 ENCOUNTER — Other Ambulatory Visit (HOSPITAL_COMMUNITY)
Admission: RE | Admit: 2017-05-31 | Discharge: 2017-05-31 | Disposition: A | Discharge status: Home / Self Care | Payer: 59 | Source: Ambulatory Visit | Attending: Obstetrics and Gynecology | Admitting: Obstetrics and Gynecology

## 2017-05-31 DIAGNOSIS — Z01411 Encounter for gynecological examination (general) (routine) with abnormal findings: Secondary | ICD-10-CM | POA: Insufficient documentation

## 2017-05-31 DIAGNOSIS — L680 Hirsutism: Secondary | ICD-10-CM | POA: Diagnosis not present

## 2017-05-31 DIAGNOSIS — Z789 Other specified health status: Secondary | ICD-10-CM | POA: Diagnosis not present

## 2017-05-31 DIAGNOSIS — E282 Polycystic ovarian syndrome: Secondary | ICD-10-CM | POA: Diagnosis not present

## 2017-05-31 DIAGNOSIS — A6004 Herpesviral vulvovaginitis: Secondary | ICD-10-CM | POA: Diagnosis not present

## 2017-05-31 MED FILL — VALACYCLOVIR HCL 500 MG TAB: 500 | 15 days supply | Qty: 30 | Fill #0

## 2017-06-01 LAB — CYTOLOGY - PAP
Diagnosis: NEGATIVE
HPV: NOT DETECTED

## 2017-06-12 DIAGNOSIS — E282 Polycystic ovarian syndrome: Secondary | ICD-10-CM | POA: Diagnosis not present

## 2017-06-14 DIAGNOSIS — Z3202 Encounter for pregnancy test, result negative: Secondary | ICD-10-CM | POA: Diagnosis not present

## 2017-06-14 DIAGNOSIS — Z30430 Encounter for insertion of intrauterine contraceptive device: Secondary | ICD-10-CM | POA: Diagnosis not present

## 2017-06-15 ENCOUNTER — Other Ambulatory Visit: Payer: Self-pay | Admitting: *Deleted

## 2017-06-15 MED ORDER — METOPROLOL TARTRATE 25 MG PO TABS: 25.0000 mg | ORAL_TABLET | Freq: Two times a day (BID) | ORAL | 0 refills | Status: DC

## 2017-06-15 MED FILL — METOPROLOL TARTRATE 25 MG T: 25 | 30 days supply | Qty: 60 | Fill #0

## 2017-06-15 NOTE — Telephone Encounter (Signed)
Patient called to request a refill on metoprolol. She is overdue for an appointment but stated that she cancelled her 05/22/17 scheduled office visit due to an emergency. She has an upcoming appointment and I made her aware that I would send one refill in but she must keep the appointment for further refills to be granted. She verbalized her understanding and appreciation.

## 2017-06-19 DIAGNOSIS — N390 Urinary tract infection, site not specified: Secondary | ICD-10-CM | POA: Diagnosis not present

## 2017-06-19 DIAGNOSIS — R300 Dysuria: Secondary | ICD-10-CM | POA: Diagnosis not present

## 2017-07-08 NOTE — Progress Notes (Signed)
Cardiology Office Note   Date:  07/09/2017   ID:  Holly [name], MRN [id number]  PCP:  [facility] [place]  Cardiologist:  Dr, Katrinka Blazing    Chief Complaint  Patient presents with  . Tachycardia      History of Present Illness: Holly [name] is a 42 y.o. female who presents for PSVT in 2016. Needs medication refill.   She has a history of PSVT treated with metoprolol. Last saw Dr. Katrinka Blazing 06/2014 and last seen in office in 12/2015  and had arrhythmia complicating pregnancy in her third trimester but normal delivery. Blood pressure was elevated that day and he recommended decrease salt intake and monitor. He recommended continuing treatment with metoprolol but did not rule out referring for EP evaluation and possible ablation if recurrent tachycardia.  In 2017 she had stress test that was normal. She has FH of early CAD.  Today only one episode of chest discomfort brief.  No palpitations and no rapid HR.    She and her husband asked if she could come off the BB.  It has been some time since she had an episode.  Will decrease BB in half now and then half a tab a day next week.  Will have her come back for BP check in 2 weeks.  Discussed she could go back into SVT and to take one table if she did.  Or if severe to go to ER.Marland Kitchen  Her 3 daughters and husband was here today as well.     Additionally her husband wanted to know why she did not have therapy, was this a anxiety problem.  I explained while anxiety may cause palpitations.  Hers was an electrical short with her heart.    Past Medical History:  Diagnosis Date  . SVT (supraventricular tachycardia) (HCC)   . Tachycardia   . Vaginal Pap smear, abnormal     Past Surgical History:  Procedure Laterality Date  . LEEP       Current Outpatient Medications  Medication Sig Dispense Refill  . metoprolol tartrate (LOPRESSOR) 25 MG tablet Take 1 tablet (25 mg total) by mouth 2 (two) times daily.  Patient must keep upcoming appointment for further refills 60 tablet 0   No current facility-administered medications for this visit.     Allergies:   Amoxicillin    Social History:  The patient  reports that she has never smoked. She has never used smokeless tobacco. She reports that she does not drink alcohol or use drugs.   Family History:  The patient's family history includes Diabetes in her father; Heart disease in her mother; Heart failure in her maternal grandmother; Hypertension in her mother.    ROS:  General:no colds or fevers, no weight changes Skin:no rashes or ulcers HEENT:no blurred vision, no congestion CV:see HPI PUL:see HPI GI:no diarrhea constipation or melena, no indigestion GU:no hematuria, no dysuria MS:no joint pain, no claudication Neuro:no syncope, no lightheadedness Endo:no diabetes, no thyroid disease  Wt Readings from Last 3 Encounters:  07/09/17 181 lb 12.8 oz (82.5 kg)  04/24/17 179 lb 6.4 oz (81.4 kg)  03/12/17 177 lb 6.4 oz (80.5 kg)     PHYSICAL EXAM: VS:  BP 140/90   Pulse 70   Ht 5\' 3"  (1.6 m)   Wt 181 lb 12.8 oz (82.5 kg)   SpO2 99%   BMI 32.20 kg/m  , BMI Body mass index is 32.2 kg/m. General:Pleasant affect, NAD Skin:Warm and  dry, brisk capillary refill HEENT:normocephalic, sclera clear, mucus membranes moist Neck:supple, no JVD, no bruits  Heart:S1S2 RRR without murmur, gallup, rub or click Lungs:clear without rales, rhonchi, or wheezes QMV:HQIOAbd:soft, non tender, + BS, do not palpate liver spleen or masses Ext:no lower ext edema, 2+ pedal pulses, 2+ radial pulses Neuro:alert and oriented X 3, MAE, follows commands, + facial symmetry    EKG:  EKG is ordered today. The ekg ordered today demonstrates SR with Rt atrial enlargement no changes from before.     Recent Labs: No results found for requested labs within last 8760 hours.    Lipid Panel No results found for: CHOL, TRIG, HDL, CHOLHDL, VLDL, LDLCALC, LDLDIRECT      Other studies Reviewed: Additional studies/ records that were reviewed today include: OV and POET see above..   ASSESSMENT AND PLAN:  1.  PSVT it has ben several years since she has had any arrhythmias, she would like to come off the BB.  We discussed and we decreased by half today and then 12.5 once a day.  Next week.  Will check with Dr. Katrinka BlazingSmith before stopping and check her BP with lower dose before stopping.  -if break through a fib pt will take lopressor 25 mg and call. If severe she will go to ER.     2.  Elevated BP will check with decreased doses of BB.    Follow up with Dr. Katrinka BlazingSmith in 3 months.  The husband would like to speak with Dr. Katrinka BlazingSmith.     Current medicines are reviewed with the patient today.  The patient Has no concerns regarding medicines.  The following changes have been made:  See above Labs/ tests ordered today include:see above  Disposition:   FU:  see above  Signed, Nada BoozerLaura Venessa Wickham, NP  07/09/2017 10:47 AM    [facility] [address], [place], [place]  [address]/ [address] [place], [place] Phone: [phone]; Fax: [phone]  [phone]

## 2017-07-09 ENCOUNTER — Ambulatory Visit (INDEPENDENT_AMBULATORY_CARE_PROVIDER_SITE_OTHER): Payer: 59 | Admitting: Cardiology

## 2017-07-09 ENCOUNTER — Encounter (INDEPENDENT_AMBULATORY_CARE_PROVIDER_SITE_OTHER): Payer: Self-pay

## 2017-07-09 ENCOUNTER — Encounter: Payer: Self-pay | Admitting: Cardiology

## 2017-07-09 VITALS — BP 140/90 | HR 70 | Ht 63.0 in | Wt 181.8 lb

## 2017-07-09 DIAGNOSIS — R030 Elevated blood-pressure reading, without diagnosis of hypertension: Secondary | ICD-10-CM

## 2017-07-09 DIAGNOSIS — I471 Supraventricular tachycardia: Secondary | ICD-10-CM

## 2017-07-09 MED ORDER — METOPROLOL TARTRATE 25 MG PO TABS: 25.0000 mg | ORAL_TABLET | Freq: Every day | ORAL | 6 refills | Status: DC

## 2017-07-09 MED FILL — METOPROLOL TARTRATE 25 MG T: 25 | 30 days supply | Qty: 30 | Fill #0

## 2017-07-09 NOTE — Patient Instructions (Signed)
Medication Instructions:  Your physician has recommended you make the following change in your medication:  1  Decrease Toprol for one week to ( 25 mg ) daily and than the following week to one half tablet (12.5 mg) . Nada BoozerLaura Ingold, NP will talk with Dr. Katrinka BlazingSmith to see if you can stop medication.    Labwork: -None  Testing/Procedures: -None  Follow-Up: Your physician recommends that you keep your scheduled  follow-up appointment to check bp. Your physician recommends that you keep your scheduled  follow-up appointment with Dr. Katrinka BlazingSmith.   Any Other Special Instructions Will Be Listed Below (If Applicable).     If you need a refill on your cardiac medications before your next appointment, please call your pharmacy.

## 2017-07-13 MED FILL — VANIQA 13.9% CREAM: 13.9 | 30 days supply | Qty: 45 | Fill #0

## 2017-07-25 ENCOUNTER — Ambulatory Visit: Payer: 59

## 2017-08-01 ENCOUNTER — Ambulatory Visit: Payer: 59

## 2017-08-21 DIAGNOSIS — Z30431 Encounter for routine checking of intrauterine contraceptive device: Secondary | ICD-10-CM | POA: Diagnosis not present

## 2017-08-22 DIAGNOSIS — Z30431 Encounter for routine checking of intrauterine contraceptive device: Secondary | ICD-10-CM | POA: Diagnosis not present

## 2017-09-12 MED FILL — METOPROLOL TARTRATE 25 MG T: 25 | 30 days supply | Qty: 30 | Fill #1

## 2017-09-27 ENCOUNTER — Encounter: Payer: Self-pay | Admitting: Interventional Cardiology

## 2017-10-05 MED FILL — VALACYCLOVIR HCL 500 MG TAB: 500 | 15 days supply | Qty: 30 | Fill #1

## 2017-10-08 MED FILL — METOPROLOL TARTRATE 25 MG T: 25 | 30 days supply | Qty: 30 | Fill #2

## 2017-10-10 NOTE — Progress Notes (Signed)
Cardiology Office Note:    Date:  10/12/2017   ID:  Holly [name], DOB 1975-03-05, MRN [id number]  [facility]P:  [facility] [place]  Cardiologist:  No primary care provider on file.   Referring MD: [facility]   Chief Complaint  Patient presents with  . Irregular Heart Beat  . Hypertension    History of Present Illness:    Holly BackerChristina L Haluska is a 42 y.o. female with a hx of PSVT.  She has a complement of other vascular risk factors including elevated blood pressure, hyperlipidemia, metabolic syndrome, and family history of stroke.  Has had intermittent palpitations lasting minutes that resolved.  Episodes are not disruptive.  She had one this morning associated with in the emergency cesarean section that was required.  The tachycardia lasted less than 2 minutes but caused her to feel lightheaded and short of breath.  No chest pain.  We spent time discussing natural history and treatment options including ablation versus continued medical therapy.  We spent time discussing primary risk modification to prevent future vascular disease.  We particularly noted her multiple risk factors including the metabolic syndrome (elevated hemoglobin A1c, moderate obesity, elevated blood pressure).  She does not smoke.   Past Medical History:  Diagnosis Date  . SVT (supraventricular tachycardia) (HCC)   . Tachycardia   . Vaginal Pap smear, abnormal     Past Surgical History:  Procedure Laterality Date  . LEEP      Current Medications: Current Meds  Medication Sig  . metoprolol tartrate (LOPRESSOR) 25 MG tablet Take 1 tablet (25 mg total) by mouth daily.     Allergies:   Amoxicillin   Social History   Socioeconomic History  . Marital status: Married    Spouse name: Not on file  . Number of children: 3  . Years of education: Not on file  . Highest education level: Not on file  Occupational History  . Occupation: surgical tech    Employer: [employer]  Social Needs  . Financial resource strain: Not on file  . Food insecurity:    Worry: Not on file    Inability: Not on file  . Transportation needs:    Medical: Not on file    Non-medical: Not on file  Tobacco Use  . Smoking status: Never Smoker  . Smokeless tobacco: Never Used  Substance and Sexual Activity  . Alcohol use: No  . Drug use: No  . Sexual activity: Yes    Birth control/protection: None  Lifestyle  . Physical activity:    Days per week: Not on file    Minutes per session: Not on file  . Stress: Not on file  Relationships  . Social connections:    Talks on phone: Not on file    Gets together: Not on file    Attends religious service: Not on file    Active member of club or organization: Not on file    Attends meetings of clubs or organizations: Not on file    Relationship status: Not on file  Other Topics Concern  . Not on file  Social History Narrative   ** Merged History Encounter **         Family History: The patient's family history includes Diabetes in her father; Heart disease in her mother; Heart failure in her maternal grandmother; Hypertension in her mother.  ROS:   Please see the history of present illness.    Some  difficulty with sleeping.  Otherwise unremarkable.  All other systems reviewed and are negative.  EKGs/Labs/Other Studies Reviewed:    The following studies were reviewed today: No recent cardiac imaging studies.  Stress test performed in December 2017 was unremarkable.  Remote echocardiogram did not demonstrate any structural cardiac abnormality.  EKG:  EKG is not ordered today.  The ekg performed on 07/09/2017 demonstrates right atrial abnormality but otherwise unremarkable.  Recent Labs: No results found for requested labs within last 8760 hours.  Recent Lipid Panel No results found for: CHOL, TRIG, HDL, CHOLHDL, VLDL, LDLCALC, LDLDIRECT  Physical Exam:    VS:  BP (!) 144/86   Pulse 71   Ht 5' 2.5" (1.588 m)    Wt 183 lb 6.4 oz (83.2 kg)   BMI 33.01 kg/m     Wt Readings from Last 3 Encounters:  10/12/17 183 lb 6.4 oz (83.2 kg)  07/09/17 181 lb 12.8 oz (82.5 kg)  04/24/17 179 lb 6.4 oz (81.4 kg)     GEN: Mild to moderate obesity well nourished, well developed in no acute distress HEENT: Normal NECK: No JVD. LYMPHATICS: No lymphadenopathy CARDIAC: RRR, no murmur, no gallop, no edema. VASCULAR: 2+ bilateral radial and carotid pulses.  No bruits. RESPIRATORY:  Clear to auscultation without rales, wheezing or rhonchi  ABDOMEN: Soft, non-tender, non-distended, No pulsatile mass, MUSCULOSKELETAL: No deformity  SKIN: Warm and dry NEUROLOGIC:  Alert and oriented x 3 PSYCHIATRIC:  Normal affect   ASSESSMENT:    1. PSVT (paroxysmal supraventricular tachycardia) (HCC)   2. Metabolic syndrome   3. Other hyperlipidemia   4. Blood pressure elevated without history of HTN   5. Obesity (BMI 30.0-34.9)    PLAN:    In order of problems listed above:  1. Change metoprolol to succinate preparation, 25 mg/day and consider increasing to 50 mg/day if blood pressures remain elevated 2. As above she does have the metabolic syndrome related to hyperlipidemia obesity and glucose intolerance with an A1c of 6.  This places her at high risk for developing vascular complications downstream and pushes [place] to address aggressive risk factor modification. 3. Target LDL less than 70.  She should be started on statin therapy. 4. Blood pressure target 130/80 mmHg.  Uptitrate beta-blocker therapy to at least 50 mg/day to achieve the target.  If blood pressure still not well controlled, given glucose intolerance, renin-angiotensin blockade would be the next medication to add. 5. Strongly encouraged aerobic and resistance training of at least 150 minutes/week.   Clinical follow-up in 6 weeks.  She will monitor blood pressures at work after starting metoprolol succinate 25 mg/day.  If blood pressures are above the target  increase Toprol-XL to 50 mg/day.  If still above the target consider adding ARB or ACE inhibitor therapy.  Sugars need to be followed closely.  Weight loss and exercise should be our main approach at this point.  With exercise and weight loss of lipid panel remains abnormal we will start low intensity statin therapy.   Medication Adjustments/Labs and Tests Ordered: Current medicines are reviewed at length with the patient today.  Concerns regarding medicines are outlined above.  No orders of the defined types were placed in this encounter.  Meds ordered this encounter  Medications  . metoprolol succinate (TOPROL-XL) 25 MG 24 hr tablet    Sig: Take 1 tablet (25 mg total) by mouth daily.    Dispense:  90 tablet    Refill:  3    Patient Instructions  Medication Instructions:  1) START Metoprolol Succinate 25mg  once daily  Labwork: None  Testing/Procedures: None  Follow-Up: Your physician wants you to follow-up in: 6 months with Dr. Katrinka Blazing.  You will receive a reminder letter in the mail two months in advance. If you don't receive a letter, please call our office to schedule the follow-up appointment.   Any Other Special Instructions Will Be Listed Below (If Applicable).  Monitor your blood pressure a couple of times a week.  Make sure to take it about 2 hours after your dose of Metoprolol.  Call the office in 1 month with those recordings.    Your blood pressure goal is 130/80 or less.   Dr. Katrinka Blazing encourages you to get in 150 minutes of moderate aerobic exercise per week.    If you need a refill on your cardiac medications before your next appointment, please call your pharmacy.      Signed, Lesleigh Noe, MD  10/12/2017 1:02 PM    [facility]

## 2017-10-12 ENCOUNTER — Encounter (INDEPENDENT_AMBULATORY_CARE_PROVIDER_SITE_OTHER): Payer: Self-pay

## 2017-10-12 ENCOUNTER — Encounter: Payer: Self-pay | Admitting: Interventional Cardiology

## 2017-10-12 ENCOUNTER — Ambulatory Visit (INDEPENDENT_AMBULATORY_CARE_PROVIDER_SITE_OTHER): Payer: 59 | Admitting: Interventional Cardiology

## 2017-10-12 VITALS — BP 144/86 | HR 71 | Ht 62.5 in | Wt 183.4 lb

## 2017-10-12 DIAGNOSIS — E669 Obesity, unspecified: Secondary | ICD-10-CM | POA: Diagnosis not present

## 2017-10-12 DIAGNOSIS — E7849 Other hyperlipidemia: Secondary | ICD-10-CM | POA: Diagnosis not present

## 2017-10-12 DIAGNOSIS — E8881 Metabolic syndrome: Secondary | ICD-10-CM | POA: Diagnosis not present

## 2017-10-12 DIAGNOSIS — I471 Supraventricular tachycardia: Secondary | ICD-10-CM

## 2017-10-12 DIAGNOSIS — R030 Elevated blood-pressure reading, without diagnosis of hypertension: Secondary | ICD-10-CM

## 2017-10-12 MED ORDER — METOPROLOL SUCCINATE ER 25 MG PO TB24: 25.0000 mg | ORAL_TABLET | Freq: Every day | ORAL | 3 refills | Status: DC

## 2017-10-12 MED FILL — METOPROLOL SUCCINATE ER 25: 25 | 90 days supply | Qty: 90 | Fill #0

## 2017-10-12 NOTE — Patient Instructions (Signed)
Medication Instructions:  1) START Metoprolol Succinate 25mg  once daily  Labwork: None  Testing/Procedures: None  Follow-Up: Your physician wants you to follow-up in: 6 months with Dr. Katrinka BlazingSmith.  You will receive a reminder letter in the mail two months in advance. If you don't receive a letter, please call our office to schedule the follow-up appointment.   Any Other Special Instructions Will Be Listed Below (If Applicable).  Monitor your blood pressure a couple of times a week.  Make sure to take it about 2 hours after your dose of Metoprolol.  Call the office in 1 month with those recordings.    Your blood pressure goal is 130/80 or less.   Dr. Katrinka BlazingSmith encourages you to get in 150 minutes of moderate aerobic exercise per week.    If you need a refill on your cardiac medications before your next appointment, please call your pharmacy.

## 2017-11-13 ENCOUNTER — Other Ambulatory Visit: Payer: Self-pay | Admitting: Obstetrics and Gynecology

## 2017-11-13 DIAGNOSIS — Z0000 Encounter for general adult medical examination without abnormal findings: Secondary | ICD-10-CM | POA: Diagnosis not present

## 2017-11-13 DIAGNOSIS — I471 Supraventricular tachycardia: Secondary | ICD-10-CM | POA: Diagnosis not present

## 2017-11-13 DIAGNOSIS — Z1231 Encounter for screening mammogram for malignant neoplasm of breast: Secondary | ICD-10-CM

## 2017-11-13 DIAGNOSIS — E282 Polycystic ovarian syndrome: Secondary | ICD-10-CM | POA: Diagnosis not present

## 2017-12-25 ENCOUNTER — Ambulatory Visit: Payer: 59

## 2017-12-28 MED FILL — VALACYCLOVIR HCL 500 MG TAB: 500 | 15 days supply | Qty: 30 | Fill #2

## 2018-01-08 MED FILL — DESCOVY 200-25 MG TABS: 200-25 | 30 days supply | Qty: 30 | Fill #0

## 2018-01-08 MED FILL — TIVICAY 50 MG TABLET: 50 | 30 days supply | Qty: 30 | Fill #0

## 2018-01-11 MED FILL — METOPROLOL SUCCINATE ER 25: 25 | 90 days supply | Qty: 90 | Fill #1

## 2018-04-19 MED FILL — METOPROLOL SUCCINATE ER 25: 25 | 90 days supply | Qty: 90 | Fill #0

## 2018-05-23 ENCOUNTER — Telehealth: Payer: Self-pay | Admitting: Interventional Cardiology

## 2018-05-23 NOTE — Telephone Encounter (Signed)
Left message with patient's spouse to have Monisha return my call. [phone], Era Bumpers.

## 2018-05-31 NOTE — Telephone Encounter (Signed)
error 

## 2018-06-09 NOTE — Progress Notes (Signed)
Virtual Visit via Telephone Note   This visit type was conducted due to national recommendations for restrictions regarding the COVID-19 Pandemic (e.g. social distancing) in an effort to limit this patient's exposure and mitigate transmission in our community.  Due to her co-morbid illnesses, this patient is at least at moderate risk for complications without adequate follow up.  This format is felt to be most appropriate for this patient at this time.  The patient did not have access to video technology/had technical difficulties with video requiring transitioning to audio format only (telephone).  All issues noted in this document were discussed and addressed.  No physical exam could be performed with this format.  Please refer to the patient's chart for her  consent to telehealth for [facility].   Date:  06/10/2018   ID:  Holly [name], DOB Mar 20, 1975, MRN [id number]  Patient Location: Home Provider Location: Home  PCP:  [facility] [place]  Cardiologist:  Dr. Verdis Prime, MD   Evaluation Performed:  Follow-Up Visit  Chief Complaint:  Follow up for PSVT and hypertension, seen for Dr. Katrinka Blazing   History of Present Illness:    Holly [name] is a 43 y.o. female with a history of PSVT treated with metoprolol, hypertension, hyperlipidemia, metabolic syndrome and family history of CVA.  Ms. Hiestand was last seen by Dr. Katrinka Blazing 10/12/2017 in follow up. At that time, she reported intermittent palpitations lasting a few minutes in duration, then would resolve on their own. Risk factor modification was discussed to prevent future vascular disease given her HLD, obesity and HbA1c of 6.0.  Her metoprolol was changed to Toprol XL 25 mg daily with consideration of increasing to 50 mg/day if blood pressures remain elevated at subsequent office visits. She was considered for statin therapy, but Dr. Katrinka Blazing would like to give her a chance with increasing her exercise regimen to  help with weight loss first. If BP continues to be uncontrolled, it was noted that she may need the addition of ACEI/ARB for >>will need to make sure she is not pregnant prior to initiation.   Today on my interview, she reports that she continues to have intermittent palpitations. 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 She does notice them more while she is working. She denies chest pain, dizziness or syncope. She has no shortness of breath. She states that they are happening about once per week and will last several minutes and then dissipate. On last occurrence, she took an extra 25 mg of Toprol with relief however she describes symptoms suggestive of mild hypotension in which she had dizziness with change of position. She also admits to increased caffeine intake in which I have suggested that she abstain from. She does report that she recently had her prescription refilled at a different pharmacy and has noticed a change in the way that the medication makes her feel. I have suggested that she reach out to the pharmacist in regards to this.  We have confirmed that she is on the correct medication.   She is without a home BP cuff and therefore was unable to give [place] a more up-to-date and accurate BP.    The patient does not have symptoms concerning for COVID-19 infection (fever, chills, cough, or new shortness of breath).   Past Medical History:  Diagnosis Date  SVT (supraventricular tachycardia) (HCC)    Tachycardia    Vaginal Pap smear, abnormal    Past Surgical History:  Procedure Laterality Date   LEEP       Current Meds  Medication Sig   metoprolol succinate (TOPROL-XL) 25 MG 24 hr tablet Take 25 mg by mouth daily. Patient can take a extra one half tablet(12.5mg ) for palpitations.   [DISCONTINUED] metoprolol succinate (TOPROL-XL) 25 MG 24 hr  tablet Take 1 tablet (25 mg total) by mouth daily.    Allergies:   Amoxicillin   Social History   Tobacco Use   Smoking status: Never Smoker   Smokeless tobacco: Never Used  Substance Use Topics   Alcohol use: No   Drug use: No    Family Hx: The patient's family history includes Diabetes in her father; Heart disease in her mother; Heart failure in her maternal grandmother; Hypertension in her mother.  ROS:   Please see the history of present illness.     All other systems reviewed and are negative.  Prior CV studies:   The following studies were reviewed today:  Exercise tolerance test 01/18/2016:   Blood pressure demonstrated a normal response to exercise.  Upsloping ST segment depression ST segment depression was noted during stress in the III, II, aVF, V4, V5 and V6 leads.  Negative, adequate stress test.  Labs/Other Tests and Data Reviewed:    EKG:  An ECG dated 07/09/2017 was personally reviewed today and demonstrated:  NSR with evidence of right atrial enlargement  Recent Labs: No results found for requested labs within last 8760 hours.   Recent Lipid Panel No results found for: CHOL, TRIG, HDL, CHOLHDL, LDLCALC, LDLDIRECT  Wt Readings from Last 3 Encounters:  10/12/17 183 lb 6.4 oz (83.2 kg)  07/09/17 181 lb 12.8 oz (82.5 kg)  04/24/17 179 lb 6.4 oz (81.4 kg)    Objective:    Vital Signs:  Ht 5\' 3"  (1.6 m)    BMI 32.49 kg/m    VITAL SIGNS:  reviewed GEN:  no acute distress EYES:  sclerae anicteric, EOMI - Extraocular Movements Intact RESPIRATORY:  normal respiratory effort, symmetric expansion SKIN:  no rash, lesions or ulcers. MUSCULOSKELETAL:  no obvious deformities. NEURO:  alert and oriented x 3, no obvious focal deficit PSYCH:  normal affect  ASSESSMENT & PLAN:    1. Paroxysmal supraventricular tachycardia: -Metoprolol changed to Toprol XL 25mg /day at last office visit with consideration to increase to 50 mg/day if BPs remain elevated>>  unable to assess BP today given no home cuff therefore we will leave her Toprol at 25 mg/day.  I have suggested that she may take an extra 12.5 mg if increased palpitations -Pt reports a change in the way the medication made her feel after using a different pharmacy for last refill several weeks ago -Will place a 2 week ZIO patch to help confirm arrhythmia versus anxiety etiology -Patient to spot check BP to allow for safer up-titration of Toprol if needed -Suggested avoiding caffeinated  beverages   2.  Hypertension: -Unable to assess given no BP cuff -Pt to spot check at work or pharmacy  -Continue Toprol 25 mg daily and pt may take an extra 12.5mg  if needed for increased palpitations  3.  Metabolic syndrome with obesity: -Reports no regular exercise>>>encouraged to increase exercise regimen to at least 30 minutes per day 4 days per week of aerobic activity  -PCP to obtain lipid panel as well as other labs  -May need to  add low dose atorvastatin  -Weight at last office visit., 183lb   COVID-19 Education: The signs and symptoms of COVID-19 were discussed with the patient and how to seek care for testing (follow up with PCP or arrange E-visit).  The importance of social distancing was discussed today.  Time:   Today, I have spent 25 minutes with the patient with telehealth technology discussing the above problems.     Medication Adjustments/Labs and Tests Ordered: Current medicines are reviewed at length with the patient today.  Concerns regarding medicines are outlined above.   Tests Ordered: No orders of the defined types were placed in this encounter.   Medication Changes: No orders of the defined types were placed in this encounter.  Disposition:  Follow up Dr. Katrinka Blazing in 3 months or sooner if needed based on test results.    Signed, Georgie Chard, NP  06/10/2018 10:15 AM     [facility]

## 2018-06-10 ENCOUNTER — Other Ambulatory Visit: Payer: Self-pay

## 2018-06-10 ENCOUNTER — Telehealth: Payer: Self-pay | Admitting: *Deleted

## 2018-06-10 ENCOUNTER — Other Ambulatory Visit: Payer: Self-pay | Admitting: Cardiology

## 2018-06-10 ENCOUNTER — Encounter: Payer: Self-pay | Admitting: Cardiology

## 2018-06-10 ENCOUNTER — Telehealth (INDEPENDENT_AMBULATORY_CARE_PROVIDER_SITE_OTHER): Payer: 59 | Admitting: Cardiology

## 2018-06-10 VITALS — Ht 63.0 in

## 2018-06-10 DIAGNOSIS — E7849 Other hyperlipidemia: Secondary | ICD-10-CM

## 2018-06-10 DIAGNOSIS — R030 Elevated blood-pressure reading, without diagnosis of hypertension: Secondary | ICD-10-CM

## 2018-06-10 DIAGNOSIS — I471 Supraventricular tachycardia: Secondary | ICD-10-CM

## 2018-06-10 DIAGNOSIS — E669 Obesity, unspecified: Secondary | ICD-10-CM

## 2018-06-10 DIAGNOSIS — E8881 Metabolic syndrome: Secondary | ICD-10-CM

## 2018-06-10 DIAGNOSIS — R002 Palpitations: Secondary | ICD-10-CM

## 2018-06-10 NOTE — Telephone Encounter (Signed)
ZIO XT long term holter monitor to be mailed to home.  Please follow instructions enclosed in monitor package,

## 2018-06-10 NOTE — Patient Instructions (Addendum)
Medication Instructions:  Your physician has recommended you make the following change in your medication:  1. Take extra one half tablet as needed (12.5 mg) for palpitations.   If you need a refill on your cardiac medications before your next appointment, please call your pharmacy.     Testing/Procedures: Your physician has recommended that you wear a Zio monitor for two weeks. Zio monitors are medical devices that record the heart's electrical activity. Doctors most often use these monitors to diagnose arrhythmias. Arrhythmias are problems with the speed or rhythm of the heartbeat. The monitor is a small, portable device. You can wear one while you do your normal daily activities. This is usually used to diagnose what is causing palpitations/syncope (passing out). The office will be calling you to mail monitor.      Follow-Up: Your physician recommends that you keep your schedule a follow-up appointment  With Dr.Smith September 10.

## 2018-06-18 ENCOUNTER — Ambulatory Visit (INDEPENDENT_AMBULATORY_CARE_PROVIDER_SITE_OTHER): Payer: 59

## 2018-06-18 DIAGNOSIS — E7849 Other hyperlipidemia: Secondary | ICD-10-CM

## 2018-06-18 DIAGNOSIS — R030 Elevated blood-pressure reading, without diagnosis of hypertension: Secondary | ICD-10-CM

## 2018-06-18 DIAGNOSIS — R002 Palpitations: Secondary | ICD-10-CM

## 2018-06-18 DIAGNOSIS — E8881 Metabolic syndrome: Secondary | ICD-10-CM | POA: Diagnosis not present

## 2018-06-18 DIAGNOSIS — E669 Obesity, unspecified: Secondary | ICD-10-CM | POA: Diagnosis not present

## 2018-06-18 DIAGNOSIS — I471 Supraventricular tachycardia: Secondary | ICD-10-CM

## 2018-07-08 MED FILL — VALACYCLOVIR HCL 500 MG TAB: 500 | 15 days supply | Qty: 30 | Fill #0

## 2018-07-18 ENCOUNTER — Other Ambulatory Visit: Payer: Self-pay

## 2018-07-18 DIAGNOSIS — R002 Palpitations: Secondary | ICD-10-CM | POA: Diagnosis not present

## 2018-07-18 DIAGNOSIS — I471 Supraventricular tachycardia: Secondary | ICD-10-CM | POA: Diagnosis not present

## 2018-07-22 ENCOUNTER — Telehealth: Payer: Self-pay | Admitting: *Deleted

## 2018-07-22 DIAGNOSIS — I471 Supraventricular tachycardia: Secondary | ICD-10-CM

## 2018-07-22 MED ORDER — METOPROLOL SUCCINATE ER 50 MG PO TB24: 50.0000 mg | ORAL_TABLET | Freq: Every day | ORAL | 3 refills | Status: DC

## 2018-07-22 MED FILL — METOPROLOL SUCCINATE ER 50: 50 | 90 days supply | Qty: 90 | Fill #0

## 2018-07-22 NOTE — Telephone Encounter (Signed)
-----   Message from Tommie Raymond, NP sent at 07/21/2018  1:23 PM EDT ----- Regarding: Increasing meds Hey! Could you please call this patient and have her increase her Metoprolol to 50mg  daily and please set her up for an EP consultation per Dr. Thompson Caul request. She wore a monitor that I placed for palpitations and turns out she has increasing frequency of SVT.   Please let me know what else I need to do  Thank you! Sharee Pimple

## 2018-07-22 NOTE — Telephone Encounter (Signed)
Spoke wit pt re: staff message below.  Pt aware to increase her Metoprolol to 50 mg qd and that we were placing a referral for EP. Pt verbalized understanding and thanked me for the call.

## 2018-07-30 MED FILL — METOPROLOL SUCCINATE ER 50: 50 | 90 days supply | Qty: 90 | Fill #0

## 2018-08-27 ENCOUNTER — Encounter: Payer: Self-pay | Admitting: Internal Medicine

## 2018-10-01 ENCOUNTER — Telehealth: Payer: Self-pay

## 2018-10-01 NOTE — Telephone Encounter (Signed)
Left detailed message about change in  appt on 10/03/2018 with Dr. Tamala Julian, and to call if she has any concerns or questions.

## 2018-10-02 NOTE — Telephone Encounter (Signed)
Attempted to contact pt again.  Left message that we will plan to do video visit like she did last time with Kathyrn Drown, NP.  Advised I will call in the morning to review meds and get BP and HR if she is able to get them.

## 2018-10-02 NOTE — Progress Notes (Signed)
Virtual Visit via Video Note   This visit type was conducted due to national recommendations for restrictions regarding the COVID-19 Pandemic (e.g. social distancing) in an effort to limit this patient's exposure and mitigate transmission in our community.  Due to her co-morbid illnesses, this patient is at least at moderate risk for complications without adequate follow up.  This format is felt to be most appropriate for this patient at this time.  All issues noted in this document were discussed and addressed.  A limited physical exam was performed with this format.  Please refer to the patient's chart for her consent to telehealth for [facility].   Date:  10/02/2018   ID:  Holly Backerhristina L Lizer, DOB 1975/12/24, MRN [id number]  Patient Location: Home Provider Location: Home  PCP:  Darrin Nipperollege, [facility] [place]  Cardiologist:  Lesleigh NoeHenry W Kenson Groh III, MD  Electrophysiologist:  None   Evaluation Performed:  Follow-Up Visit  Chief Complaint:  PSVT  History of Present Illness:    Holly [name] is a 43 y.o. female with PSVT treated with metoprolol, hypertension, hyperlipidemia, metabolic syndrome and family history of CVA.  The patient continues to have difficulty with palpitations.  The monitor which was performed demonstrated SVT for up to 6 hours at times.  An attempt was made to uptitrate intensity of beta-blocker therapy however she states the medication caused her to feel funny and therefore she stopped taking it.  Going forward she prefers to use lifestyle changes and naturalistic approach to management.  We have further discussion concerning risk factor modification as a measure of primary prevention of future cardiac events.  She does have prediabetes, elevated blood pressures without a diagnosis of hypertension, and hyperlipidemia.  A long discussion ensued concerning risk factor modification.  Again with the use concerns, she chooses lifestyle changes as opposed to  medication.  The patient does not have symptoms concerning for COVID-19 infection (fever, chills, cough, or new shortness of breath).    Past Medical History:  Diagnosis Date   SVT (supraventricular tachycardia) (HCC)    Tachycardia    Vaginal Pap smear, abnormal    Past Surgical History:  Procedure Laterality Date   LEEP       No outpatient medications have been marked as taking for the 10/03/18 encounter (Appointment) with Lyn RecordsSmith, Everleigh Colclasure W, MD.     Allergies:   Amoxicillin   Social History   Tobacco Use   Smoking status: Never Smoker   Smokeless tobacco: Never Used  Substance Use Topics   Alcohol use: No   Drug use: No     Family Hx: The patient's family history includes Diabetes in her father; Heart disease in her mother; Heart failure in her maternal grandmother; Hypertension in her mother.  ROS:   Please see the history of present illness.    Leery of medications. Stress due to PANDEMIC.  All other systems reviewed and are negative.   Prior CV studies:   The following studies were reviewed today:  Long Term Monitor 05/2018: Study Highlights   Basic rhythm is NSR  SVT in 145-178 bpm range lasting > 6 hours.  WCT 10 beats probably SVT with pre-excitation. R/O VT  Ventricular bigeminy vs. Alternating pre-excitation.     Labs/Other Tests and Data Reviewed:    EKG:  No ECG reviewed.  Recent Labs: No results found for requested labs within last 8760 hours.   Recent Lipid Panel No results found for: CHOL, TRIG, HDL, CHOLHDL, LDLCALC, LDLDIRECT  Wt Readings from Last 3 Encounters:  10/12/17 183 lb 6.4 oz (83.2 kg)  07/09/17 181 lb 12.8 oz (82.5 kg)  04/24/17 179 lb 6.4 oz (81.4 kg)     Objective:    Vital Signs:  There were no vitals taken for this visit.   VITAL SIGNS:  reviewed No vital signs reported.  ASSESSMENT & PLAN:    1. PSVT (paroxysmal supraventricular tachycardia) ([place])   2. Metabolic syndrome   3. Other hyperlipidemia     4. Elevated BP without diagnosis of hypertension   5. Educated About Covid-19 Virus Infection    PLAN:  1. Watchful waiting with the patient currently committing to lifestyle changes.  We will continue to monitor.  She will use metoprolol as needed. 2. Weight loss, aerobic activity, and low carbohydrate diet is recommended 3. Lifestyle modification. 4. Target 130/80 mmHg 5. Social distancing, handwashing, and mask wearing.  Overall education and awareness concerning primary/secondary risk prevention was discussed in detail: LDL less than 70, hemoglobin A1c less than 7, blood pressure target less than 130/80 mmHg, >150 minutes of moderate aerobic activity per week, avoidance of smoking, weight control (via diet and exercise), and continued surveillance/management of/for obstructive sleep apnea.    COVID-19 Education: The signs and symptoms of COVID-19 were discussed with the patient and how to seek care for testing (follow up with PCP or arrange E-visit).  The importance of social distancing was discussed today.  Time:   Today, I have spent 15 minutes with the patient with telehealth technology discussing the above problems.     Medication Adjustments/Labs and Tests Ordered: Current medicines are reviewed at length with the patient today.  Concerns regarding medicines are outlined above.   Tests Ordered: No orders of the defined types were placed in this encounter.   Medication Changes: No orders of the defined types were placed in this encounter.   Follow Up:  In Person in 1 year(s)  Signed, Sinclair Grooms, MD  10/02/2018 5:49 PM    [place]

## 2018-10-03 ENCOUNTER — Encounter: Payer: Self-pay | Admitting: Interventional Cardiology

## 2018-10-03 ENCOUNTER — Telehealth (INDEPENDENT_AMBULATORY_CARE_PROVIDER_SITE_OTHER): Payer: 59 | Admitting: Interventional Cardiology

## 2018-10-03 ENCOUNTER — Other Ambulatory Visit: Payer: Self-pay

## 2018-10-03 VITALS — Ht 63.0 in | Wt 180.0 lb

## 2018-10-03 DIAGNOSIS — I471 Supraventricular tachycardia: Secondary | ICD-10-CM | POA: Diagnosis not present

## 2018-10-03 DIAGNOSIS — Z7189 Other specified counseling: Secondary | ICD-10-CM

## 2018-10-03 DIAGNOSIS — E7849 Other hyperlipidemia: Secondary | ICD-10-CM

## 2018-10-03 DIAGNOSIS — E8881 Metabolic syndrome: Secondary | ICD-10-CM

## 2018-10-03 DIAGNOSIS — R030 Elevated blood-pressure reading, without diagnosis of hypertension: Secondary | ICD-10-CM

## 2018-10-03 NOTE — Patient Instructions (Signed)

## 2019-01-27 MED FILL — VALACYCLOVIR HCL 500 MG TAB: 500 | 15 days supply | Qty: 30 | Fill #1

## 2019-03-04 NOTE — Telephone Encounter (Signed)
Error

## 2019-11-21 ENCOUNTER — Other Ambulatory Visit (HOSPITAL_COMMUNITY): Payer: Self-pay | Admitting: Internal Medicine

## 2019-11-21 DIAGNOSIS — Z13220 Encounter for screening for lipoid disorders: Secondary | ICD-10-CM | POA: Diagnosis not present

## 2019-11-21 DIAGNOSIS — R5382 Chronic fatigue, unspecified: Secondary | ICD-10-CM | POA: Diagnosis not present

## 2019-11-21 DIAGNOSIS — Z0000 Encounter for general adult medical examination without abnormal findings: Secondary | ICD-10-CM | POA: Diagnosis not present

## 2019-11-21 DIAGNOSIS — E282 Polycystic ovarian syndrome: Secondary | ICD-10-CM | POA: Diagnosis not present

## 2019-11-21 DIAGNOSIS — E669 Obesity, unspecified: Secondary | ICD-10-CM | POA: Diagnosis not present

## 2019-11-21 MED FILL — VIT D2 1.25 MG (50,000 UNIT: 1.25 MG | 28 days supply | Qty: 4 | Fill #0

## 2019-12-12 DIAGNOSIS — Z713 Dietary counseling and surveillance: Secondary | ICD-10-CM | POA: Diagnosis not present

## 2019-12-12 DIAGNOSIS — E669 Obesity, unspecified: Secondary | ICD-10-CM | POA: Diagnosis not present

## 2019-12-23 DIAGNOSIS — Z01419 Encounter for gynecological examination (general) (routine) without abnormal findings: Secondary | ICD-10-CM | POA: Diagnosis not present

## 2020-01-27 DIAGNOSIS — E663 Overweight: Secondary | ICD-10-CM | POA: Diagnosis not present

## 2020-03-16 DIAGNOSIS — Z711 Person with feared health complaint in whom no diagnosis is made: Secondary | ICD-10-CM | POA: Diagnosis not present

## 2020-03-16 DIAGNOSIS — N920 Excessive and frequent menstruation with regular cycle: Secondary | ICD-10-CM | POA: Diagnosis not present

## 2020-03-17 DIAGNOSIS — E663 Overweight: Secondary | ICD-10-CM | POA: Diagnosis not present

## 2020-04-05 DIAGNOSIS — R519 Headache, unspecified: Secondary | ICD-10-CM | POA: Diagnosis not present

## 2020-04-05 DIAGNOSIS — J069 Acute upper respiratory infection, unspecified: Secondary | ICD-10-CM | POA: Diagnosis not present

## 2020-04-05 DIAGNOSIS — Z20822 Contact with and (suspected) exposure to covid-19: Secondary | ICD-10-CM | POA: Diagnosis not present

## 2020-04-06 ENCOUNTER — Other Ambulatory Visit (HOSPITAL_COMMUNITY): Payer: Self-pay | Admitting: Obstetrics and Gynecology

## 2020-04-13 DIAGNOSIS — E663 Overweight: Secondary | ICD-10-CM | POA: Diagnosis not present

## 2020-04-24 ENCOUNTER — Other Ambulatory Visit: Payer: Self-pay

## 2020-04-24 ENCOUNTER — Encounter (HOSPITAL_COMMUNITY): Payer: Self-pay | Admitting: Emergency Medicine

## 2020-04-24 ENCOUNTER — Ambulatory Visit (INDEPENDENT_AMBULATORY_CARE_PROVIDER_SITE_OTHER): Payer: PRIVATE HEALTH INSURANCE

## 2020-04-24 ENCOUNTER — Ambulatory Visit (HOSPITAL_COMMUNITY)
Admission: EM | Admit: 2020-04-24 | Discharge: 2020-04-24 | Disposition: A | Discharge status: Home / Self Care | Payer: PRIVATE HEALTH INSURANCE | Attending: Urgent Care | Admitting: Urgent Care

## 2020-04-24 DIAGNOSIS — M79644 Pain in right finger(s): Secondary | ICD-10-CM | POA: Diagnosis not present

## 2020-04-24 DIAGNOSIS — S61216A Laceration without foreign body of right little finger without damage to nail, initial encounter: Secondary | ICD-10-CM

## 2020-04-24 DIAGNOSIS — Z23 Encounter for immunization: Secondary | ICD-10-CM

## 2020-04-24 MED ORDER — LIDOCAINE HCL 2 % IJ SOLN
INTRAMUSCULAR | Status: AC
Start: 2020-04-24 — End: ?
  Filled 2020-04-24: qty 20

## 2020-04-24 MED ORDER — TETANUS-DIPHTH-ACELL PERTUSSIS 5-2.5-18.5 LF-MCG/0.5 IM SUSY
0.5000 mL | PREFILLED_SYRINGE | Freq: Once | INTRAMUSCULAR | Status: AC
  Administered 2020-04-24: 0.5 mL via INTRAMUSCULAR

## 2020-04-24 MED ORDER — NAPROXEN 500 MG PO TABS: 500.0000 mg | ORAL_TABLET | Freq: Two times a day (BID) | ORAL | 0 refills | Status: DC

## 2020-04-24 MED ORDER — TETANUS-DIPHTH-ACELL PERTUSSIS 5-2.5-18.5 LF-MCG/0.5 IM SUSY
PREFILLED_SYRINGE | INTRAMUSCULAR | Status: AC
  Filled 2020-04-24: qty 0.5

## 2020-04-24 NOTE — Discharge Instructions (Signed)

## 2020-04-24 NOTE — ED Triage Notes (Signed)
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  Was pulling a cart through a door way, right middle finger wedged between door facing and cart.  Multiple irregular lacerations to right little finger.  Bleeding controlled, visible swelling.  Limited range of motion, super sensitive finger tip.

## 2020-04-24 NOTE — ED Provider Notes (Signed)
[facility]   MRN: [id number] DOB: Jan 02, 1976  Subjective:   Holly [name] is a 45 y.o. female presenting for suffering a right little finger laceration.  Patient states that she accidentally caught her hand between a door and medication car.  As she pulled her finger away she suffered lacerations of her right fifth finger.  Cannot recall last Tdap but believes it was greater than 10 years ago.  Denies loss of sensation.  She does have decreased range of motion due to pain.  No current facility-administered medications for this encounter.  Current Outpatient Medications:  .  metoprolol succinate (TOPROL-XL) 50 MG 24 hr tablet, Take 1 tablet (50 mg total) by mouth daily. Take with or immediately following a meal., Disp: 90 tablet, Rfl: 3   Allergies  Allergen Reactions  . Amoxicillin Itching    Has patient had a PCN reaction causing immediate rash, facial/tongue/throat swelling, SOB or lightheadedness with hypotension:No Has patient had a PCN reaction causing severe rash involving mucus membranes or skin necrosis:No Has patient had a PCN reaction that required hospitalization:No Has patient had a PCN reaction occurring within the last 10 years:No If all of the above answers are "NO", then may proceed with Cephalosporin use.     Past Medical History:  Diagnosis Date  . SVT (supraventricular tachycardia) (HCC)   . Tachycardia   . Vaginal Pap smear, abnormal      Past Surgical History:  Procedure Laterality Date  . LEEP      Family History  Problem Relation Age of Onset  . Heart disease Mother   . Hypertension Mother   . Diabetes Father   . Heart failure Maternal Grandmother     Social History   Tobacco Use  . Smoking status: Never Smoker  . Smokeless tobacco: Never Used  Vaping Use  . Vaping Use: Never used  Substance Use Topics  . Alcohol use: No  . Drug use: No    ROS   Objective:   Vitals: BP 136/82 (BP Location: Right Arm)    Pulse 78   Temp 98 F (36.7 C) (Oral)   Resp 18   LMP 04/24/2020   SpO2 100%   Physical Exam Constitutional:      General: She is not in acute distress.    Appearance: Normal appearance. She is well-developed. She is not ill-appearing, toxic-appearing or diaphoretic.  HENT:     Head: Normocephalic and atraumatic.     Nose: Nose normal.     Mouth/Throat:     Mouth: Mucous membranes are moist.     Pharynx: Oropharynx is clear.  Eyes:     General: No scleral icterus.    Extraocular Movements: Extraocular movements intact.     Pupils: Pupils are equal, round, and reactive to light.  Cardiovascular:     Rate and Rhythm: Normal rate.  Pulmonary:     Effort: Pulmonary effort is normal.  Musculoskeletal:       Hands:  Skin:    General: Skin is warm and dry.  Neurological:     General: No focal deficit present.     Mental Status: She is alert and oriented to person, place, and time.  Psychiatric:        Mood and Affect: Mood normal.        Behavior: Behavior normal.        Thought Content: Thought content normal.        Judgment: Judgment normal.  DG Finger Little Right  Result Date: 04/24/2020 CLINICAL DATA:  Trauma. EXAM: RIGHT LITTLE FINGER 2+V COMPARISON:  None. FINDINGS: Soft tissue swelling about the pinky. No evidence of acute fracture. No dislocation. IMPRESSION: Soft tissue swelling without evidence of acute fracture or dislocation. Electronically Signed   By: Feliberto Harts MD   On: 04/24/2020 11:09   PROCEDURE NOTE: laceration repair Verbal consent obtained from patient.  Local anesthesia with 2cc Lidocaine 2% without epinephrine.  Wound explored for tendon, ligament damage. Wound scrubbed with soap and water and rinsed. Wound closed with #2 4-0 Prolene (simple interrupted) sutures.  Wound cleansed and dressed.   Assessment and Plan :   PDMP not reviewed this encounter.  1. Finger pain, right   2. Laceration of right little finger without foreign body  without damage to nail, initial encounter     Laceration repaired successfully. Wound care reviewed. Recommended Tylenol and/or ibuprofen for pain control.  Follow-up with hand specialist on-call, Dr. Nevada Crane, if patient continues to have difficulty with her range of motion.  Return-to-clinic precautions discussed, patient verbalized understanding. Otherwise, follow up in 7-10 days for suture removal.     Wallis Bamberg, PA-C 04/24/20 1133

## 2020-05-03 ENCOUNTER — Other Ambulatory Visit (HOSPITAL_COMMUNITY): Payer: Self-pay

## 2020-05-03 MED ORDER — TRAMADOL HCL 50 MG PO TABS
50.0000 mg | ORAL_TABLET | Freq: Three times a day (TID) | ORAL | 0 refills | Status: DC
  Filled 2020-05-03: qty 30, 10d supply, fill #0

## 2020-05-05 ENCOUNTER — Encounter: Payer: Self-pay | Admitting: Occupational Therapy

## 2020-05-05 ENCOUNTER — Ambulatory Visit: Payer: PRIVATE HEALTH INSURANCE | Attending: Occupational Medicine | Admitting: Occupational Therapy

## 2020-05-05 ENCOUNTER — Other Ambulatory Visit: Payer: Self-pay

## 2020-05-05 DIAGNOSIS — M25641 Stiffness of right hand, not elsewhere classified: Secondary | ICD-10-CM | POA: Insufficient documentation

## 2020-05-05 DIAGNOSIS — R600 Localized edema: Secondary | ICD-10-CM | POA: Insufficient documentation

## 2020-05-05 DIAGNOSIS — M79641 Pain in right hand: Secondary | ICD-10-CM | POA: Insufficient documentation

## 2020-05-05 NOTE — Patient Instructions (Addendum)
Access Code: [email or web address] URL: [email or web address] Date: 05/05/2020 Prepared by: Merleen Milliner  Exercises Seated Passive Composite Digit Flexion - 1 x daily - 7 x weekly - 3 sets - 10 reps Digit Flexion Extension - 1 x daily - 7 x weekly - 3 sets - 10 reps Finger PIP Flexion Extension with Blocking - 1 x daily - 7 x weekly - 3 sets - 10 reps   Heat, massage for swelling, exercise as above, then ice afterward as needed.

## 2020-05-05 NOTE — Therapy (Signed)
[facility] [address] [place], [place], [address] Phone: [phone]   Fax:  [phone]  Occupational Therapy Evaluation  Patient Details  Name: Holly [name] MRN: [id number] Date of Birth: 1975/04/04 Referring Provider (OT): Dr Lucia Gaskins   Encounter Date: 05/05/2020   OT End of Session - 05/05/20 1638    Visit Number 1    Number of Visits 4    Date for OT Re-Evaluation 06/19/20    Authorization Type Worker's Comp    Authorization Time Period approved 4 visits    OT Start Time 1530    OT Stop Time 1625    OT Time Calculation (min) 55 min    Equipment Utilized During Treatment heat    Activity Tolerance Patient tolerated treatment well    Behavior During Therapy [facility] for tasks assessed/performed           Past Medical History:  Diagnosis Date  . SVT (supraventricular tachycardia) (HCC)   . Tachycardia   . Vaginal Pap smear, abnormal     Past Surgical History:  Procedure Laterality Date  . LEEP      There were no vitals filed for this visit.   Subjective Assessment - 05/05/20 1536    Subjective  Patient caught her finger between a cart and a door    Patient is accompanied by: Family member    Currently in Pain? Yes    Pain Score 5     Pain Location Finger (Comment which one)    Pain Orientation Right    Pain Descriptors / Indicators Aching;Burning    Pain Type Acute pain    Pain Onset 1 to 4 weeks ago    Pain Frequency Intermittent    Aggravating Factors  Movement or contact    Pain Relieving Factors pain medicine             [facility] OT Assessment - 05/05/20 0001      Assessment   Medical Diagnosis laceration 5th digit    Referring Provider (OT) Dr Lucia Gaskins    Onset Date/Surgical Date 04/24/20    Hand Dominance Left    Next MD Visit 05/16/20    Prior Therapy NA      Precautions   Precaution Comments No use of right hand at work      Prior Function   Level of Independence  Independent with basic ADLs    Vocation Full time employment    Vocation Requirements Surgical tech      ADL   ADL comments Using dominant left hand predominantly for ADL - Slow, labored process for self care      Written Expression   Dominant Hand Left      Posture/Postural Control   Posture/Postural Control No significant limitations      Sensation   Light Touch Impaired by gross assessment   reports burning volar fingers   Hot/Cold Appears Intact    Additional Comments Hypersensitive volar surface digits - burning sensation      Coordination   Fine Motor Movements are Fluid and Coordinated No      Edema   Edema mild digits      ROM / Strength   AROM / PROM / Strength AROM      AROM   Overall AROM  Deficits    Overall AROM Comments wrist flexion/ extension left 75/50      Right Hand AROM   R Index  MCP 0-90 70 Degrees  R Index PIP 0-100 85 Degrees    R Index DIP 0-70 20 Degrees    R Long  MCP 0-90 70 Degrees    R Long PIP 0-100 85 Degrees    R Long DIP 0-70 45 Degrees    R Ring  MCP 0-90 70 Degrees    R Ring PIP 0-100 85 Degrees    R Ring DIP 0-70 20 Degrees    R Little  MCP 0-90 40 Degrees    R Little PIP 0-100 10 Degrees    R Little DIP 0-70 0 Degrees                           OT Education - 05/05/20 1637    Education Details heat, edema massage/ edema management, exercises, ice as needed.    Person(s) Educated Patient;Child(ren)    Methods Explanation;Demonstration;Tactile cues;Verbal cues;Handout    Comprehension Verbalized understanding;Returned demonstration               OT Long Term Goals - 05/05/20 1645      OT LONG TERM GOAL #1   Title Patient will complete edema managemet techniques independently    Time 4    Period Weeks    Status New      OT LONG TERM GOAL #2   Title Patient will complete HEP designed to improve active and passive range of motion of right hand    Time 4    Period Weeks    Status New      OT  LONG TERM GOAL #3   Title Patient will demonstrate at least 75% composite flex/ext in digits 2-5    Time 4    Period Weeks    Status New      OT LONG TERM GOAL #4   Title Patient will report pain no more than 3/10 when completing exercise or light functional tasks with RUE    Time 4    Period Weeks    Status New                 Plan - 05/05/20 1641    Clinical Impression Statement Patient is a 45 yr old employee who injured her 5th digit on her non dominant hand at work on 04/24/20.  Patient was seen for follow up visit and suture removal 05/04/20 and is referred for range of motion and edema management.  Patient limited by stiffness and pain this visit, but responded well to initial education.    OT Occupational Profile and History Problem Focused Assessment - Including review of records relating to presenting problem    Occupational performance deficits (Please refer to evaluation for details): ADL's;IADL's;Work    Body Structure / Function / Physical Skills ADL;Coordination;Endurance;Scar mobility;UE functional use;Decreased knowledge of precautions;Fascial restriction;Sensation;Skin integrity;Pain;IADL;Flexibility;Decreased knowledge of use of DME;Dexterity;FMC;Strength;Wound;ROM;Edema    Rehab Potential Excellent    Clinical Decision Making Limited treatment options, no task modification necessary    Comorbidities Affecting Occupational Performance: None    Modification or Assistance to Complete Evaluation  No modification of tasks or assist necessary to complete eval    OT Frequency 1x / week    OT Duration --   3 more visits   OT Treatment/Interventions Self-care/ADL training;Therapeutic exercise;Patient/family education;Splinting;Compression bandaging;Moist Heat;Fluidtherapy;Scar mobilization;Therapeutic activities;Manual Therapy;DME and/or AE instruction;Contrast Bath;Ultrasound;Cryotherapy    Plan Continue gentle active and passive range of motion, and increase functional use  of right hand    OT Home Exercise Plan  digit blocking, composite flexion/extension           Patient will benefit from skilled therapeutic intervention in order to improve the following deficits and impairments:   Body Structure / Function / Physical Skills: ADL,Coordination,Endurance,Scar mobility,UE functional use,Decreased knowledge of precautions,Fascial restriction,Sensation,Skin integrity,Pain,IADL,Flexibility,Decreased knowledge of use of DME,Dexterity,FMC,Strength,Wound,ROM,Edema       Visit Diagnosis: Pain in right hand - Plan: Ot plan of care cert/re-cert  Stiffness of right hand, not elsewhere classified - Plan: Ot plan of care cert/re-cert  Localized edema - Plan: Ot plan of care cert/re-cert    Problem List Patient Active Problem List   Diagnosis Date Noted  . Family history of early CAD 01/03/2016  . Positive GBS test 06/19/2014  . Vaginal delivery 06/19/2014  . AMA (advanced maternal age) multigravida 35+   . Maternal arrhythmia complicating pregnancy   . Polyhydramnios   . Fetal renal anomaly   . PSVT (paroxysmal supraventricular tachycardia) (HCC) 01/21/2014    Collier Salina, OTR/L 05/05/2020, 4:51 PM  [facility] [address] [place], [place], [address] Phone: [phone]   Fax:  [phone]  Name: Holly [name] MRN: [id number] Date of Birth: 1975/06/08

## 2020-05-13 ENCOUNTER — Ambulatory Visit: Payer: PRIVATE HEALTH INSURANCE | Attending: Occupational Medicine | Admitting: Occupational Therapy

## 2020-05-13 ENCOUNTER — Other Ambulatory Visit: Payer: Self-pay

## 2020-05-13 DIAGNOSIS — R600 Localized edema: Secondary | ICD-10-CM | POA: Insufficient documentation

## 2020-05-13 DIAGNOSIS — M25641 Stiffness of right hand, not elsewhere classified: Secondary | ICD-10-CM | POA: Insufficient documentation

## 2020-05-13 DIAGNOSIS — M79641 Pain in right hand: Secondary | ICD-10-CM | POA: Diagnosis not present

## 2020-05-13 NOTE — Patient Instructions (Addendum)
Flexor Tendon Gliding (Active Hook Fist)   With fingers and knuckles straight, bend middle and tip joints. Do not bend large knuckles. Repeat _10-15___ times. Do _4-6___ sessions per day.  MP Flexion (Active)   With back of hand on table, bend large knuckles as far as they will go, keeping small joints straight. Repeat _10-15___ times. Do __4-6__ sessions per day. Activity: Reach into a narrow container.*      Finger Flexion / Extension   With palm up, bend fingers of left hand toward palm, making a  fist. Straighten fingers, opening fist. Repeat sequence _10-15___ times per session. Do _4-6__ sessions per day. Hand Variation: Palm down   Copyright  VHI. All rights reserved.   MP Flexion (Active Isolated)   Bend ______ finger at large knuckle, keeping other fingers straight. Do not bend tips. Repeat _10-15___ times. Do __4-6__ sessions per day.  AROM: PIP Flexion / Extension   Pinch bottom knuckle of ____pinky____ finger of hand to prevent bending. Actively bend middle knuckle until stretch is felt. Hold __5__ seconds. Relax. Straighten finger as far as possible. Repeat __10-15__ times per set. Do _4-6___ sessions per day.    AROM: DIP Flexion / Extension   Pinch middle knuckle of ________ finger of  hand to prevent bending. Bend end knuckle until stretch is felt. Hold _5___ seconds. Relax. Straighten finger as far as possible. Repeat _10-15___ times per set.  Do _4-6___ sessions per day.  AROM: Finger Flexion / Extension   Actively bend fingers of  hand. Start with knuckles furthest from palm, and slowly make a fist. Hold __5__ seconds. Relax. Then straighten fingers as far as possible. Repeat _10-15___ times per set.  Do _4-6___ sessions per day.Gently help with your other hand , you can use your buddy strap on small and ring fingers Copyright  VHI. All rights reserved.

## 2020-05-13 NOTE — Therapy (Addendum)
[facility] [address] [place], [place], [address] Phone: [phone]   Fax:  [phone]  Occupational Therapy Treatment  Patient Details  Name: Holly [name] MRN: [id number] Date of Birth: December 27, 1975 Referring Provider (OT): Dr Lucia Gaskins   Encounter Date: 05/13/2020   OT End of Session - 05/13/20 1539    Visit Number 2    Number of Visits 4    Date for OT Re-Evaluation 06/19/20    Authorization Type Worker's Comp    Authorization Time Period approved 4 visits    OT Start Time 1535    OT Stop Time 1615    OT Time Calculation (min) 40 min           Past Medical History:  Diagnosis Date  . SVT (supraventricular tachycardia) (HCC)   . Tachycardia   . Vaginal Pap smear, abnormal     Past Surgical History:  Procedure Laterality Date  . LEEP      There were no vitals filed for this visit.   Subjective Assessment - 05/13/20 1539    Subjective  Patient caught her finger between a cart and a door    Currently in Pain? Yes    Pain Score 5     Pain Location Finger (Comment which one)    Pain Orientation Right    Pain Descriptors / Indicators Aching    Pain Type Acute pain    Pain Onset More than a month ago    Pain Frequency Intermittent    Aggravating Factors  movement    Pain Relieving Factors pain meds , not moving                  Treatment: Fluidotherapy x 7 mins to RUE for pain relief and desensitization, wound is now closed. Pt was only able to tolerate 7 mins due to hypersensitivity. No adverse reactions. Pt was educated in A/ROM HEP-see pt instructions and AA/ROM/ P/ROM composite flexion with buddy strap used. Pt flipped playing cards and slid cards off of deck with RUE for functional use. Ice pack x 8 mins end of session.No adverse reactions.  Pt demonstrates continued significant pain, stiffness and hypersensitivity which would limit work activities. Therapist recommends that  pt. participates in several more OT visits prior to return to work as pt does not have the ROM, or strength to perform work activities at this time. Pt is also in significant pain with functional use of RUE.              OT Education - 05/13/20 1623    Education Details issued A/ROM HEP, with pt providing gentle passive stretch in composite flexion- see pt instructions, buddy strap issued and pt instructed in use, use of heat before exercises and ice after    Person(s) Educated Patient    Methods Explanation;Demonstration;Tactile cues;Verbal cues;Handout    Comprehension Verbalized understanding;Returned demonstration               OT Long Term Goals - 05/05/20 1645      OT LONG TERM GOAL #1   Title Patient will complete edema managemet techniques independently    Time 4    Period Weeks    Status New      OT LONG TERM GOAL #2   Title Patient will complete HEP designed to improve active and passive range of motion of right hand    Time 4    Period Weeks    Status New  OT LONG TERM GOAL #3   Title Patient will demonstrate at least 75% composite flex/ext in digits 2-5    Time 4    Period Weeks    Status New      OT LONG TERM GOAL #4   Title Patient will report pain no more than 3/10 when completing exercise or light functional tasks with RUE    Time 4    Period Weeks    Status New                 Plan - 05/13/20 1540    Clinical Impression Statement Pt reports continued pain in 5th digit with movement and items touching it. Pt is progressing towards goals    OT Occupational Profile and History Problem Focused Assessment - Including review of records relating to presenting problem    Occupational performance deficits (Please refer to evaluation for details): ADL's;IADL's;Work    Body Structure / Function / Physical Skills ADL;Coordination;Endurance;Scar mobility;UE functional use;Decreased knowledge of precautions;Fascial restriction;Sensation;Skin  integrity;Pain;IADL;Flexibility;Decreased knowledge of use of DME;Dexterity;FMC;Strength;Wound;ROM;Edema    Rehab Potential Excellent    Clinical Decision Making Limited treatment options, no task modification necessary    Comorbidities Affecting Occupational Performance: None    Modification or Assistance to Complete Evaluation  No modification of tasks or assist necessary to complete eval    OT Frequency 1x / week    OT Duration --   3 more visits   OT Treatment/Interventions Self-care/ADL training;Therapeutic exercise;Patient/family education;Splinting;Compression bandaging;Moist Heat;Fluidtherapy;Scar mobilization;Therapeutic activities;Manual Therapy;DME and/or AE instruction;Contrast Bath;Ultrasound;Cryotherapy    Plan Continue gentle active and passive range of motion, and increase functional use of right hand    OT Home Exercise Plan digit blocking, composite flexion/extension           Patient will benefit from skilled therapeutic intervention in order to improve the following deficits and impairments:   Body Structure / Function / Physical Skills: ADL,Coordination,Endurance,Scar mobility,UE functional use,Decreased knowledge of precautions,Fascial restriction,Sensation,Skin integrity,Pain,IADL,Flexibility,Decreased knowledge of use of DME,Dexterity,FMC,Strength,Wound,ROM,Edema       Visit Diagnosis: Pain in right hand  Stiffness of right hand, not elsewhere classified  Localized edema    Problem List Patient Active Problem List   Diagnosis Date Noted  . Family history of early CAD 01/03/2016  . Positive GBS test 06/19/2014  . Vaginal delivery 06/19/2014  . AMA (advanced maternal age) multigravida 35+   . Maternal arrhythmia complicating pregnancy   . Polyhydramnios   . Fetal renal anomaly   . PSVT (paroxysmal supraventricular tachycardia) (HCC) 01/21/2014    Holly [name] 05/13/2020, 4:34 PM Keene Breath, OTR/L [phone] Phone: [phone] 4:34 PM  05/13/20 [facility] [address] [place], [place], [address] Phone: [phone]   Fax:  [phone]  Name: Holly [name] MRN: [id number] Date of Birth: 12/12/1975

## 2020-05-18 ENCOUNTER — Other Ambulatory Visit: Payer: Self-pay

## 2020-05-18 ENCOUNTER — Ambulatory Visit: Payer: PRIVATE HEALTH INSURANCE | Admitting: Occupational Therapy

## 2020-05-18 ENCOUNTER — Encounter: Payer: Self-pay | Admitting: Occupational Therapy

## 2020-05-18 DIAGNOSIS — R600 Localized edema: Secondary | ICD-10-CM | POA: Diagnosis not present

## 2020-05-18 DIAGNOSIS — M25641 Stiffness of right hand, not elsewhere classified: Secondary | ICD-10-CM | POA: Diagnosis not present

## 2020-05-18 DIAGNOSIS — M79641 Pain in right hand: Secondary | ICD-10-CM | POA: Diagnosis not present

## 2020-05-18 NOTE — Therapy (Signed)
[facility] [address] [place], [place], [address] Phone: [phone]   Fax:  [phone]  Occupational Therapy Treatment  Patient Details  Name: Holly [name] MRN: [id number] Date of Birth: 10-11-1975 Referring Provider (OT): Dr Lucia Gaskins   Encounter Date: 05/18/2020   OT End of Session - 05/18/20 1844    Visit Number 3    Number of Visits 4    Date for OT Re-Evaluation 06/19/20    Authorization Type Worker's Comp    Authorization Time Period approved 4 visits    OT Start Time 1745    OT Stop Time 1835    OT Time Calculation (min) 50 min    Equipment Utilized During Treatment ultrasound    Activity Tolerance Patient tolerated treatment well    Behavior During Therapy [facility] for tasks assessed/performed           Past Medical History:  Diagnosis Date  . SVT (supraventricular tachycardia) (HCC)   . Tachycardia   . Vaginal Pap smear, abnormal     Past Surgical History:  Procedure Laterality Date  . LEEP      There were no vitals filed for this visit.   Subjective Assessment - 05/18/20 1755    Subjective  Patient reports that she saw Dr Alto [place] and will be returning to work tomorrow with a modified schedule    Currently in Pain? Yes    Pain Score 4     Pain Location Finger (Comment which one)    Pain Orientation Right    Pain Descriptors / Indicators Aching    Pain Type Acute pain    Pain Onset More than a month ago    Pain Frequency Intermittent    Aggravating Factors  movement    Pain Relieving Factors medication              [facility] OT Assessment - 05/18/20 0001      Right Hand AROM   R Index  MCP 0-90 90 Degrees    R Long  MCP 0-90 90 Degrees    R Ring  MCP 0-90 90 Degrees    R Little  MCP 0-90 75 Degrees    R Little PIP 0-100 35 Degrees    R Little DIP 0-70 20 Degrees                    OT Treatments/Exercises (OP) - 05/18/20 0001      ADLs   ADL Comments Encouraged  gentle use of hand for light ADL tasks - patient still guarding whole hand and having intermittent stiffness and swelling      Modalities   Modalities Fluidotherapy;Ultrasound      Ultrasound   Ultrasound Location digits - volar and dorsal    Ultrasound Parameters , 20%duty cycle, 7 min, 0.8 w/cm2    Ultrasound Goals Edema;Pain;Other (Comment)   Range of motion     RUE Fluidotherapy   Number Minutes Fluidotherapy 4 Minutes    RUE Fluidotherapy Location Hand;Wrist    Comments Wrapped into slight fist - unable to tolerate heat                  OT Education - 05/18/20 1842    Education Details Issued strap for right 5th digit to encourage prolonged stretch to IP Flexion.  Patient able to tolerate and adjust - responded well. Desensitization - ok to gently rub hands with washcloth    Person(s) Educated Patient  Methods Explanation;Demonstration    Comprehension Verbalized understanding;Returned demonstration               OT Long Term Goals - 05/18/20 1846      OT LONG TERM GOAL #1   Title Patient will complete edema managemet techniques independently    Time 4    Period Weeks    Status Achieved      OT LONG TERM GOAL #2   Title Patient will complete HEP designed to improve active and passive range of motion of right hand    Time 4    Period Weeks    Status Achieved      OT LONG TERM GOAL #3   Title Patient will demonstrate at least 75% composite flex/ext in digits 2-5    Time 4    Period Weeks    Status On-going      OT LONG TERM GOAL #4   Title Patient will report pain no more than 3/10 when completing exercise or light functional tasks with RUE    Time 4    Period Weeks    Status On-going                 Plan - 05/18/20 1845    Clinical Impression Statement Pt reports slight decrease in pain in right hand and range of motion is improving.  Patient would benefit from additional OT to help regaing range of motion and strength in right hand     OT Occupational Profile and History Problem Focused Assessment - Including review of records relating to presenting problem    Occupational performance deficits (Please refer to evaluation for details): ADL's;IADL's;Work    Body Structure / Function / Physical Skills ADL;Coordination;Endurance;Scar mobility;UE functional use;Decreased knowledge of precautions;Fascial restriction;Sensation;Skin integrity;Pain;IADL;Flexibility;Decreased knowledge of use of DME;Dexterity;FMC;Strength;Wound;ROM;Edema    Rehab Potential Excellent    Clinical Decision Making Limited treatment options, no task modification necessary    Comorbidities Affecting Occupational Performance: None    Modification or Assistance to Complete Evaluation  No modification of tasks or assist necessary to complete eval    OT Frequency 1x / week    OT Duration --   3 more visits   OT Treatment/Interventions Self-care/ADL training;Therapeutic exercise;Patient/family education;Splinting;Compression bandaging;Moist Heat;Fluidtherapy;Scar mobilization;Therapeutic activities;Manual Therapy;DME and/or AE instruction;Contrast Bath;Ultrasound;Cryotherapy    Plan Continue gentle active and passive range of motion, and increase functional use of right hand    OT Home Exercise Plan digit blocking, composite flexion/extension    Consulted and Agree with Plan of Care Patient           Patient will benefit from skilled therapeutic intervention in order to improve the following deficits and impairments:   Body Structure / Function / Physical Skills: ADL,Coordination,Endurance,Scar mobility,UE functional use,Decreased knowledge of precautions,Fascial restriction,Sensation,Skin integrity,Pain,IADL,Flexibility,Decreased knowledge of use of DME,Dexterity,FMC,Strength,Wound,ROM,Edema       Visit Diagnosis: Pain in right hand  Stiffness of right hand, not elsewhere classified  Localized edema    Problem List Patient Active Problem List    Diagnosis Date Noted  . Family history of early CAD 01/03/2016  . Positive GBS test 06/19/2014  . Vaginal delivery 06/19/2014  . AMA (advanced maternal age) multigravida 35+   . Maternal arrhythmia complicating pregnancy   . Polyhydramnios   . Fetal renal anomaly   . PSVT (paroxysmal supraventricular tachycardia) (HCC) 01/21/2014    Collier Salina, OTR/L 05/18/2020, 6:47 PM  [facility] [address] [place], [place], [address] Phone:  [phone]   Fax:  [phone]  Name: KURSTEN KRUK MRN: [id number] Date of Birth: Feb 26, 1975

## 2020-05-19 DIAGNOSIS — S6991XD Unspecified injury of right wrist, hand and finger(s), subsequent encounter: Secondary | ICD-10-CM | POA: Diagnosis not present

## 2020-05-25 ENCOUNTER — Ambulatory Visit: Payer: PRIVATE HEALTH INSURANCE | Attending: Occupational Medicine | Admitting: Occupational Therapy

## 2020-05-25 ENCOUNTER — Other Ambulatory Visit: Payer: Self-pay

## 2020-05-25 DIAGNOSIS — R600 Localized edema: Secondary | ICD-10-CM | POA: Diagnosis not present

## 2020-05-25 DIAGNOSIS — M79641 Pain in right hand: Secondary | ICD-10-CM

## 2020-05-25 DIAGNOSIS — M25641 Stiffness of right hand, not elsewhere classified: Secondary | ICD-10-CM | POA: Diagnosis not present

## 2020-05-25 NOTE — Patient Instructions (Signed)
    PIP Flexion (Passive)   Use other hand to bend the middle joint of __small____ finger down as far as possible. Hold _10___ seconds. Repeat __5__ times. Do _3___ sessions per day.    PROM: Finger DIP Joints   Passively bend ___small_____ finger(s) of  hand at tip joint until stretch is felt. Hold __10__ seconds. Relax. Straighten finger as far as possible. Repeat _5___ times per set.  Do __4-6__ sessions per day.  Copyright  VHI. All rights reserved.

## 2020-05-26 NOTE — Therapy (Signed)
[facility] [address] [place], [place], [address] Phone: [phone]   Fax:  [phone]  Occupational Therapy Treatment  Patient Details  Name: Holly [name] MRN: [id number] Date of Birth: 07-20-1975 Referring Provider (OT): Dr Lucia Gaskins   Encounter Date: 05/25/2020   OT End of Session - 05/25/20 1325    Visit Number 4    Number of Visits 12    Date for OT Re-Evaluation 06/19/20    Authorization Type Worker's Comp    Authorization Time Period approved 4 visits +8 additional visits    Authorization - Visit Number 4    Authorization - Number of Visits 12    OT Start Time 1320    OT Stop Time 1408    OT Time Calculation (min) 48 min           Past Medical History:  Diagnosis Date  . SVT (supraventricular tachycardia) (HCC)   . Tachycardia   . Vaginal Pap smear, abnormal     Past Surgical History:  Procedure Laterality Date  . LEEP      There were no vitals filed for this visit.   Subjective Assessment - 05/25/20 1323    Subjective  Pt reports that her PCP took her out of work    Currently in Pain? Yes    Pain Score 3     Pain Location Finger (Comment which one)    Pain Orientation Right    Pain Descriptors / Indicators Aching    Pain Type Acute pain    Pain Onset More than a month ago    Pain Frequency Intermittent    Aggravating Factors  movement    Pain Relieving Factors meds                 treatment:Fluidotherapy x 6 mins for desensitization. Pt reported she could not tolerate longer than this. No adverse reactions. [place] , 0.8 w/cm 2, 20% x 8 mins to volar right hand and digits 4,5, no adverse reactions afterwards, pt reports discomfort during [place] due to hypersensitivity. Revieiwed A/ROM exercises and added P/ROM exercises. Pt reports she has not been using flexion strap at home and she has not been exercising throughout the day. Pt was encouraged to increase her exercise and  desensitization at home.  Ice pack applied x 8 mins end of session for pain, no adverse reactions.     A/ROM 5th digit finger flexion MP 85, PIP 60, DIP 40          OT Education - 05/26/20 1221    Education Details Reviewed importance of desensitization and exercising 4-6 x per day. Therapist Reveiwed A/ROM composite flexion, PIP flexion A/ROM and passive ROM , passive composite flexion place and hold    Person(s) Educated Patient;Spouse    Methods Explanation;Demonstration;Verbal cues   therapist reveiwed Blocking exercises but did not issue handout   Comprehension Verbalized understanding;Returned demonstration;Verbal cues required               OT Long Term Goals - 05/26/20 1220      OT LONG TERM GOAL #1   Title Patient will complete edema managemet techniques independently    Time 4    Period Weeks    Status Achieved      OT LONG TERM GOAL #2   Title Patient will complete HEP designed to improve active and passive range of motion of right hand    Time 4    Period Weeks  Status Achieved      OT LONG TERM GOAL #3   Title Patient will demonstrate at least 75% composite flex/ext in digits 2-5    Time 4    Period Weeks    Status On-going   not consistent     OT LONG TERM GOAL #4   Title Patient will report pain no more than 3/10 when completing exercise or light functional tasks with RUE    Time 4    Period Weeks    Status On-going      OT LONG TERM GOAL #5   Title Pt will resume use of RUE as domainant hand at least 75% of the time for ADLs/IADLs.    Time 8    Period Weeks    Status New                 Plan - 05/26/20 1214    Clinical Impression Statement Pt is progressing slowly towards goals. She remains limited by pain, hypersensitivity and stiffness. Pt can benefit from additional occupational therapy to to maximize pt's functional use of RUE for ADLs and work activities.    OT Occupational Profile and History Problem Focused Assessment -  Including review of records relating to presenting problem    Occupational performance deficits (Please refer to evaluation for details): ADL's;IADL's;Work    Body Structure / Function / Physical Skills ADL;Coordination;Endurance;Scar mobility;UE functional use;Decreased knowledge of precautions;Fascial restriction;Sensation;Skin integrity;Pain;IADL;Flexibility;Decreased knowledge of use of DME;Dexterity;FMC;Strength;Wound;ROM;Edema    Rehab Potential Excellent    Clinical Decision Making Limited treatment options, no task modification necessary    Comorbidities Affecting Occupational Performance: None    Modification or Assistance to Complete Evaluation  No modification of tasks or assist necessary to complete eval    OT Frequency 1x / week   or 8 additional visits- approved by [facility]   OT Duration 8 weeks   3 more visits   OT Treatment/Interventions Self-care/ADL training;Therapeutic exercise;Patient/family education;Splinting;Compression bandaging;Moist Heat;Fluidtherapy;Scar mobilization;Therapeutic activities;Manual Therapy;DME and/or AE instruction;Contrast Bath;Ultrasound;Cryotherapy    Plan Continue gentle active and passive range of motion, and increase functional use of right hand    OT Home Exercise Plan digit blocking, composite flexion/extension    Consulted and Agree with Plan of Care Patient           Patient will benefit from skilled therapeutic intervention in order to improve the following deficits and impairments:   Body Structure / Function / Physical Skills: ADL,Coordination,Endurance,Scar mobility,UE functional use,Decreased knowledge of precautions,Fascial restriction,Sensation,Skin integrity,Pain,IADL,Flexibility,Decreased knowledge of use of DME,Dexterity,FMC,Strength,Wound,ROM,Edema       Visit Diagnosis: Pain in right hand - Plan: Ot plan of care cert/re-cert  Stiffness of right hand, not elsewhere classified - Plan: Ot plan of care cert/re-cert  Localized edema -  Plan: Ot plan of care cert/re-cert    Problem List Patient Active Problem List   Diagnosis Date Noted  . Family history of early CAD 01/03/2016  . Positive GBS test 06/19/2014  . Vaginal delivery 06/19/2014  . AMA (advanced maternal age) multigravida 35+   . Maternal arrhythmia complicating pregnancy   . Polyhydramnios   . Fetal renal anomaly   . PSVT (paroxysmal supraventricular tachycardia) (HCC) 01/21/2014    Mariselda Badalamenti 05/26/2020, 12:43 PM Keene Breath, OTR/L [phone] Phone: [phone] 12:43 PM 05/26/20 [facility] [address] [place], [place], [address] Phone: [phone]   Fax:  [phone]  Name: CAREY LAFON MRN: [id number] Date of Birth: 09/05/1975

## 2020-06-01 ENCOUNTER — Ambulatory Visit: Payer: PRIVATE HEALTH INSURANCE | Admitting: Occupational Therapy

## 2020-06-03 ENCOUNTER — Ambulatory Visit: Payer: PRIVATE HEALTH INSURANCE | Admitting: Occupational Therapy

## 2020-06-03 ENCOUNTER — Encounter: Payer: Self-pay | Admitting: Occupational Therapy

## 2020-06-03 ENCOUNTER — Other Ambulatory Visit: Payer: Self-pay

## 2020-06-03 DIAGNOSIS — R600 Localized edema: Secondary | ICD-10-CM | POA: Diagnosis not present

## 2020-06-03 DIAGNOSIS — M25641 Stiffness of right hand, not elsewhere classified: Secondary | ICD-10-CM

## 2020-06-03 DIAGNOSIS — M79641 Pain in right hand: Secondary | ICD-10-CM | POA: Diagnosis not present

## 2020-06-03 NOTE — Therapy (Signed)
[facility] [address] [place], [place], [address] Phone: [phone]   Fax:  [phone]  Occupational Therapy Treatment  Patient Details  Name: Holly [name] MRN: [id number] Date of Birth: October 09, 1975 Referring Provider (OT): Dr Lucia Gaskins   Encounter Date: 06/03/2020   OT End of Session - 06/03/20 1838    Visit Number 5    Number of Visits 12    Date for OT Re-Evaluation 06/19/20    Authorization Type Worker's Comp    Authorization Time Period approved 4 visits +8 additional visits    Authorization - Visit Number 5    Authorization - Number of Visits 12    OT Start Time 1745    OT Stop Time 1830    OT Time Calculation (min) 45 min    Equipment Utilized During Treatment ultrasound    Activity Tolerance Patient tolerated treatment well    Behavior During Therapy [facility] for tasks assessed/performed           Past Medical History:  Diagnosis Date  . SVT (supraventricular tachycardia) (HCC)   . Tachycardia   . Vaginal Pap smear, abnormal     Past Surgical History:  Procedure Laterality Date  . LEEP      There were no vitals filed for this visit.   Subjective Assessment - 06/03/20 1751    Subjective  Patient reports that her PCP held her out of work until 6/2    Patient is accompanied by: Family member    Currently in Pain? Yes    Pain Score 3     Pain Location Finger (Comment which one)    Pain Orientation Right    Pain Descriptors / Indicators Dull    Pain Type Acute pain    Pain Onset More than a month ago    Pain Frequency Intermittent    Aggravating Factors  dependent position - swelling    Pain Relieving Factors meds                        OT Treatments/Exercises (OP) - 06/03/20 0001      ADLs   ADL Comments Reviewed goals and encouraged patient to reinstate right hand back into functional activities.  Patient's scar came off this session - tissue underneath looks well  healed.      Hand Exercises   Other Hand Exercises Composite flexion - active assisted and active.      Ultrasound   Ultrasound Location 5th digit volar, dorsal    Ultrasound Parameters , 20%, 7 min 0.8w/cm2    Ultrasound Goals Edema;Pain      RUE Fluidotherapy   Number Minutes Fluidotherapy 8 Minutes    RUE Fluidotherapy Location Hand;Wrist      Manual Therapy   Manual Therapy Soft tissue mobilization    Soft tissue mobilization Self soft tissue mob/desensitization of right hand  on massage ball                  OT Education - 06/03/20 1836    Education Details Reinforced importance of continued motion and functional use of RUE.  Reviewed goal of use of RUE as dominant 75%    Person(s) Educated Patient;Child(ren)    Methods Explanation    Comprehension Verbalized understanding               OT Long Term Goals - 06/03/20 1840      OT LONG TERM GOAL #1  Title Patient will complete edema managemet techniques independently    Time 4    Period Weeks    Status Achieved      OT LONG TERM GOAL #2   Title Patient will complete HEP designed to improve active and passive range of motion of right hand    Time 4    Period Weeks    Status Achieved      OT LONG TERM GOAL #3   Title Patient will demonstrate at least 75% composite flex/ext in digits 2-5    Time 4    Period Weeks    Status Achieved   not consistent     OT LONG TERM GOAL #4   Title Patient will report pain no more than 3/10 when completing exercise or light functional tasks with RUE    Time 4    Period Weeks    Status On-going      OT LONG TERM GOAL #5   Title Pt will resume use of RUE as domainant hand at least 75% of the time for ADLs/IADLs.    Time 8    Period Weeks    Status On-going                 Plan - 06/03/20 1838    Clinical Impression Statement Pt is progressing towards goals. She remains limited by pain, hypersensitivity and stiffness. Patient is hoping to return to  work by 06/24/20    OT Occupational Profile and History Problem Focused Assessment - Including review of records relating to presenting problem    Occupational performance deficits (Please refer to evaluation for details): ADL's;IADL's;Work    Body Structure / Function / Physical Skills ADL;Coordination;Endurance;Scar mobility;UE functional use;Decreased knowledge of precautions;Fascial restriction;Sensation;Skin integrity;Pain;IADL;Flexibility;Decreased knowledge of use of DME;Dexterity;FMC;Strength;Wound;ROM;Edema    Rehab Potential Excellent    Clinical Decision Making Limited treatment options, no task modification necessary    Comorbidities Affecting Occupational Performance: None    Modification or Assistance to Complete Evaluation  No modification of tasks or assist necessary to complete eval    OT Frequency 1x / week   or 8 additional visits- approved by [facility]   OT Duration 8 weeks   3 more visits   OT Treatment/Interventions Self-care/ADL training;Therapeutic exercise;Patient/family education;Splinting;Compression bandaging;Moist Heat;Fluidtherapy;Scar mobilization;Therapeutic activities;Manual Therapy;DME and/or AE instruction;Contrast Bath;Ultrasound;Cryotherapy    Plan Continue gentle active and passive range of motion, and increase functional use of right hand    OT Home Exercise Plan digit blocking, composite flexion/extension    Consulted and Agree with Plan of Care Patient           Patient will benefit from skilled therapeutic intervention in order to improve the following deficits and impairments:   Body Structure / Function / Physical Skills: ADL,Coordination,Endurance,Scar mobility,UE functional use,Decreased knowledge of precautions,Fascial restriction,Sensation,Skin integrity,Pain,IADL,Flexibility,Decreased knowledge of use of DME,Dexterity,FMC,Strength,Wound,ROM,Edema       Visit Diagnosis: Pain in right hand  Stiffness of right hand, not elsewhere  classified  Localized edema    Problem List Patient Active Problem List   Diagnosis Date Noted  . Family history of early CAD 01/03/2016  . Positive GBS test 06/19/2014  . Vaginal delivery 06/19/2014  . AMA (advanced maternal age) multigravida 35+   . Maternal arrhythmia complicating pregnancy   . Polyhydramnios   . Fetal renal anomaly   . PSVT (paroxysmal supraventricular tachycardia) (HCC) 01/21/2014    Collier Salina, OTR/L 06/03/2020, 6:42 PM  [facility] [address]  [place], [place], [address] Phone: [phone]   Fax:  [phone]  Name: Holly [name] MRN: [id number] Date of Birth: 1975/11/30

## 2020-06-09 ENCOUNTER — Encounter: Payer: Self-pay | Admitting: Occupational Therapy

## 2020-06-09 ENCOUNTER — Ambulatory Visit: Payer: PRIVATE HEALTH INSURANCE | Admitting: Occupational Therapy

## 2020-06-09 ENCOUNTER — Other Ambulatory Visit: Payer: Self-pay

## 2020-06-09 DIAGNOSIS — M25641 Stiffness of right hand, not elsewhere classified: Secondary | ICD-10-CM

## 2020-06-09 DIAGNOSIS — M79641 Pain in right hand: Secondary | ICD-10-CM

## 2020-06-09 DIAGNOSIS — R600 Localized edema: Secondary | ICD-10-CM | POA: Diagnosis not present

## 2020-06-09 NOTE — Patient Instructions (Signed)
DESENSITIZING YOUR HAND  What is "desenesitization"?  Following an injury to the hand a painful increase in sensation sometimes develops.  This hypersensitivity may be in response to touch, cold temperatures or in severe cases, even to wind blowing on the hand.  This hypersensitivity may be localized to a small area or may be widespread over an entire hand.  The initial injury causing the hypersensitivity may either be a rather severe injury but may times is only a trivial injury.  Desensitization is the process of decreasing the painful hypersensitivity by gradually exposing the sensitive area to a variety of carefully selected stimuli.  What will happen if my hand is not desensitized?  Over-protecting the sensitive are by not using it or covering it with a bandage or glove will only cause the sensitive area to become more painful to touch.  This, in turn, leads to increased stiffness, weakness and loss of use in the hand.  How do I desensitize my hand?  The goal of desensitization is to gradually build up a tolerance to stronger and stronger stimuli as the level of pain decreases.  A variety of objects of different textures are used to rub against the sensitive hand in such a way that the coarseness of the textures and the rubbing time are gradually increased as the pain diminishes over a period of days and weeks.  The following steps have been set up by your hand therapist and surgeon to serve as a general guide during your therapy and to help you better understand the proper techniques involved so that ultimately a more pain-free, useful hand is obtained.  1. Warm (not hot) water soaks or heating pad on a low setting. 2. Lotion 3. Rubbing exercises:  Using your normal hand, gently rub the most hypersensitive area with a back and forth motion for 30 seconds, rest briefly, and then repeat.  As tolerance to rubbing improves, slowly increase the pressure of rubbing one hand against the  other.  Gradually work up to a deep circular massage.  Once deep massage is tolerated, advance to gently rubbing the sensitive hand against selected materials with increasingly rougher surfaces as indicated below.  It is important to begin with soft, smooth textures and gradually work up to rough, hard textures.  Suggested sequence of desensitization materials: 1)  Cotton balls  2)  Flannel shirt or cotton shirt  3)  Soft velvet or felt  4)  Pants  5)  Towel  6)  Upholstery or rug 7)  Sand paper   Submerge hand into containers of the following household materials and slowly move your fingers about, grasping the materials and releasing them, it is beneficial to submerge the hand quickly and then pull it back out... Work on increasing speed!  The following materials are recommended: cotton balls, popcorn, rice, macaroni, beans or sand.  Rub a brush along the hand.  Massage the hand with vibrating instruments such as an Neurosurgeon, electric toothbrush or vibrator.  Tapping exercises: finger can be tapped against the various textures listed above.  Gradually increase the speed and force used with tapping.  To improve your fine motor coordination and to encourage normal use of the hand the following exercises are useful: 1.  Nuts and bolts 2.  Clothes pins 3.  Picking up  4.  Using a typewriter  5.  Piano  Avoid 1. Hot water.  This may increase swelling and cause more pain and stiffness. 2. Painful activities.  Desensitization may be  somewhat uncomfortable, but it should not actually be painful. 3. Over-protection. Again, covering the sensitive area or avoiding use of the painful finger or hand will only increase painful sensitivity.  Therefore, excessive pain, heat and over-protection may do more harm than good.  You should not experience increased pain and swelling following your desensitization exercises.  Your therapist or surgeon will tailor the protocol to help meet your specific  needs.  Do not hesitate to ask them any questions that you may have.  USE THE HAND AS NORMALLY AS POSSIBLE  Rub or massage your finger for 5 mins 2x day with lotion.  1. Grip Strengthening (Resistive Putty)   Squeeze putty using thumb and all fingers. Repeat _20___ times. Do __2__ sessions per day.   2. Roll putty into tube on table and pinch between each finger and thumb x 10 reps each. (can do ring and small finger together)     Copyright  VHI. All rights reserved.

## 2020-06-09 NOTE — Therapy (Signed)
[facility] [address] [place], [place], [address] Phone: [phone]   Fax:  [phone]  Occupational Therapy Treatment  Patient Details  Name: Holly [name] MRN: [id number] Date of Birth: 06/03/1975 Referring Provider (OT): Dr Holly [name]   Encounter Date: 06/09/2020   OT End of Session - 06/09/20 0935    Visit Number 6    Number of Visits 12    Date for OT Re-Evaluation 06/19/20    Authorization Type Worker's Comp    Authorization Time Period approved 4 visits +8 additional visits    Authorization - Visit Number 6    Authorization - Number of Visits 12    OT Start Time 0933    OT Stop Time 1013    OT Time Calculation (min) 40 min    Activity Tolerance Patient tolerated treatment well    Behavior During Therapy [facility] for tasks assessed/performed           Past Medical History:  Diagnosis Date  . SVT (supraventricular tachycardia) (HCC)   . Tachycardia   . Vaginal Pap smear, abnormal     Past Surgical History:  Procedure Laterality Date  . LEEP      There were no vitals filed for this visit.   Subjective Assessment - 06/09/20 0934    Subjective  Pt reports she hopes to return to work 6/2    Currently in Pain? Yes    Pain Score 3     Pain Location Finger (Comment which one)    Pain Orientation Right    Pain Descriptors / Indicators Aching    Pain Type Acute pain    Pain Onset More than a month ago    Pain Frequency Intermittent    Aggravating Factors  use    Pain Relieving Factors meds                        Treatment: fluidotherapy x 10 mins today, for desensitization, no adverse reactions( pt able to tolerate increased time!) Tendon gliding exercises for increased A/ROM, min v.c [place] 3 mhz , 0.8 w/cm 2, 20% x 6 mins  To small and ring gingers for edema and scar mobilization no adverse reactions. Pt continues to demonstrate hypersensitivity at small finger. Pt expresses  concerns regarding ability to return to her previous job as there is lifting involved.        OT treatment/  Education - 06/09/20 1016    Education Details Desensitization techniques, scar massage(pt returned demonstration), importance of light functional use of hand, yellow putty exercises, see pt instructions.    Person(s) Educated Patient    Methods Explanation;Demonstration;Verbal cues;Handout    Comprehension Verbalized understanding;Returned demonstration;Verbal cues required               OT Long Term Goals - 06/03/20 1840      OT LONG TERM GOAL #1   Title Patient will complete edema managemet techniques independently    Time 4    Period Weeks    Status Achieved      OT LONG TERM GOAL #2   Title Patient will complete HEP designed to improve active and passive range of motion of right hand    Time 4    Period Weeks    Status Achieved      OT LONG TERM GOAL #3   Title Patient will demonstrate at least 75% composite flex/ext in digits 2-5    Time 4  Period Weeks    Status Achieved   not consistent     OT LONG TERM GOAL #4   Title Patient will report pain no more than 3/10 when completing exercise or light functional tasks with RUE    Time 4    Period Weeks    Status On-going      OT LONG TERM GOAL #5   Title Pt will resume use of RUE as domainant hand at least 75% of the time for ADLs/IADLs.    Time 8    Period Weeks    Status On-going                 Plan - 06/09/20 0936    Clinical Impression Statement Pt is progressing towards goals, her overall pain and ROM is improving. She continues to use RUE a limited amount at home and she reports that she has not been perfroming desensitization.    OT Occupational Profile and History Problem Focused Assessment - Including review of records relating to presenting problem    Occupational performance deficits (Please refer to evaluation for details): ADL's;IADL's;Work    Body Structure / Function /  Physical Skills ADL;Coordination;Endurance;Scar mobility;UE functional use;Decreased knowledge of precautions;Fascial restriction;Sensation;Skin integrity;Pain;IADL;Flexibility;Decreased knowledge of use of DME;Dexterity;FMC;Strength;Wound;ROM;Edema    Rehab Potential Excellent    Clinical Decision Making Limited treatment options, no task modification necessary    Comorbidities Affecting Occupational Performance: None    Modification or Assistance to Complete Evaluation  No modification of tasks or assist necessary to complete eval    OT Frequency 1x / week   or 8 additional visits- approved by [facility]   OT Duration 8 weeks   3 more visits   OT Treatment/Interventions Self-care/ADL training;Therapeutic exercise;Patient/family education;Splinting;Compression bandaging;Moist Heat;Fluidtherapy;Scar mobilization;Therapeutic activities;Manual Therapy;DME and/or AE instruction;Contrast Bath;Ultrasound;Cryotherapy    Plan ROM, gentle strengthening, functional use of hand    OT Home Exercise Plan digit blocking, composite flexion/extension    Consulted and Agree with Plan of Care Patient           Patient will benefit from skilled therapeutic intervention in order to improve the following deficits and impairments:   Body Structure / Function / Physical Skills: ADL,Coordination,Endurance,Scar mobility,UE functional use,Decreased knowledge of precautions,Fascial restriction,Sensation,Skin integrity,Pain,IADL,Flexibility,Decreased knowledge of use of DME,Dexterity,FMC,Strength,Wound,ROM,Edema       Visit Diagnosis: Pain in right hand  Stiffness of right hand, not elsewhere classified  Localized edema    Problem List Patient Active Problem List   Diagnosis Date Noted  . Family history of early CAD 01/03/2016  . Positive GBS test 06/19/2014  . Vaginal delivery 06/19/2014  . AMA (advanced maternal age) multigravida 35+   . Maternal arrhythmia complicating pregnancy   . Polyhydramnios   . Fetal  renal anomaly   . PSVT (paroxysmal supraventricular tachycardia) (HCC) 01/21/2014    Holly [name] 06/09/2020, 1:01 PM  [facility] [address] [place], [place], [address] Phone: [phone]   Fax:  [phone]  Name: Holly [name] MRN: [id number] Date of Birth: 07/23/1975

## 2020-06-16 ENCOUNTER — Ambulatory Visit: Payer: PRIVATE HEALTH INSURANCE | Admitting: Occupational Therapy

## 2020-06-16 ENCOUNTER — Other Ambulatory Visit: Payer: Self-pay

## 2020-06-16 ENCOUNTER — Encounter: Payer: Self-pay | Admitting: Occupational Therapy

## 2020-06-16 DIAGNOSIS — R600 Localized edema: Secondary | ICD-10-CM

## 2020-06-16 DIAGNOSIS — M25641 Stiffness of right hand, not elsewhere classified: Secondary | ICD-10-CM | POA: Diagnosis not present

## 2020-06-16 DIAGNOSIS — M79641 Pain in right hand: Secondary | ICD-10-CM | POA: Diagnosis not present

## 2020-06-16 NOTE — Therapy (Signed)
[facility] [address] [place], [place], [address] Phone: [phone]   Fax:  [phone]  Occupational Therapy Treatment  Patient Details  Name: Holly [name] MRN: [id number] Date of Birth: Jul 30, 1975 Referring Provider (OT): Dr Lucia Gaskins   Encounter Date: 06/16/2020   OT End of Session - 06/16/20 1500    Visit Number 7    Number of Visits 12    Date for OT Re-Evaluation 06/19/20    Authorization Type Worker's Comp    Authorization Time Period approved 4 visits +8 additional visits    Authorization - Visit Number 7    Authorization - Number of Visits 12    OT Start Time 1230    OT Stop Time 1310    OT Time Calculation (min) 40 min    Activity Tolerance Patient tolerated treatment well    Behavior During Therapy [facility] for tasks assessed/performed           Past Medical History:  Diagnosis Date  . SVT (supraventricular tachycardia) (HCC)   . Tachycardia   . Vaginal Pap smear, abnormal     Past Surgical History:  Procedure Laterality Date  . LEEP      There were no vitals filed for this visit.   Subjective Assessment - 06/16/20 1243    Subjective  I am taking over the counter naproxen and using aleve cream - back to work June 7th    Currently in Pain? Yes    Pain Score 2     Pain Location Finger (Comment which one)    Pain Orientation Right    Pain Descriptors / Indicators Aching    Pain Onset More than a month ago    Pain Frequency Intermittent    Aggravating Factors  use    Pain Relieving Factors meds              [facility] OT Assessment - 06/16/20 0001      Right Hand AROM   R Little  MCP 0-90 90 Degrees    R Little PIP 0-100 90 Degrees    R Little DIP 0-70 60 Degrees                    OT Treatments/Exercises (OP) - 06/16/20 0001      ADLs   Home Maintenance Patient reports slowly trying to integrate right hand into functional tasks - reviewed goal of 75%.  Patient  feels she may be at 50% - not folding laundry r washing dishes, not peeling/chopping etc - but starting to do a little cooking.  Had practice washing dishes with BUE - Cueing needed intermittenlty to encourage shaping hand to dish.  Worked on folding a load of laundry and carrying laundry basket short distance to place on top of washer.    ADL Comments Reviewed progress and remaining goals.  Anticipate discharge next visit.      RUE Fluidotherapy   Number Minutes Fluidotherapy 10 Minutes    RUE Fluidotherapy Location Hand;Wrist    Comments prior to stretching / exercise                  OT Education - 06/16/20 1459    Education Details return to more functional use of right UE    Person(s) Educated Patient    Methods Explanation;Demonstration;Verbal cues    Comprehension Verbalized understanding;Returned demonstration               OT Long Term Goals -  06/16/20 1254      OT LONG TERM GOAL #1   Title Patient will complete edema managemet techniques independently    Time 4    Period Weeks    Status Achieved      OT LONG TERM GOAL #2   Title Patient will complete HEP designed to improve active and passive range of motion of right hand    Time 4    Period Weeks    Status Achieved      OT LONG TERM GOAL #3   Title Patient will demonstrate at least 75% composite flex/ext in digits 2-5    Time 4    Period Weeks    Status Achieved   not consistent     OT LONG TERM GOAL #4   Title Patient will report pain no more than 3/10 when completing exercise or light functional tasks with RUE    Time 4    Period Weeks    Status On-going      OT LONG TERM GOAL #5   Title Pt will resume use of RUE as non-dominant hand at least 75% of the time for ADLs/IADLs.    Time 8    Period Weeks    Status On-going                 Plan - 06/16/20 1500    Clinical Impression Statement Pt is progressing towards goals, and  overall pain and ROM is improving. Anticipate discharge  next visit with strong encouragement for patient to continue to use RUE functionally    OT Occupational Profile and History Problem Focused Assessment - Including review of records relating to presenting problem    Occupational performance deficits (Please refer to evaluation for details): ADL's;IADL's;Work    Body Structure / Function / Physical Skills ADL;Coordination;Endurance;Scar mobility;UE functional use;Decreased knowledge of precautions;Fascial restriction;Sensation;Skin integrity;Pain;IADL;Flexibility;Decreased knowledge of use of DME;Dexterity;FMC;Strength;Wound;ROM;Edema    Rehab Potential Excellent    Clinical Decision Making Limited treatment options, no task modification necessary    Comorbidities Affecting Occupational Performance: None    Modification or Assistance to Complete Evaluation  No modification of tasks or assist necessary to complete eval    OT Frequency 1x / week   or 8 additional visits- approved by [facility]   OT Duration 8 weeks   3 more visits   OT Treatment/Interventions Self-care/ADL training;Therapeutic exercise;Patient/family education;Splinting;Compression bandaging;Moist Heat;Fluidtherapy;Scar mobilization;Therapeutic activities;Manual Therapy;DME and/or AE instruction;Contrast Bath;Ultrasound;Cryotherapy    Plan ROM, gentle strengthening, functional use of hand, d/c?    OT Home Exercise Plan digit blocking, composite flexion/extension    Consulted and Agree with Plan of Care Patient           Patient will benefit from skilled therapeutic intervention in order to improve the following deficits and impairments:   Body Structure / Function / Physical Skills: ADL,Coordination,Endurance,Scar mobility,UE functional use,Decreased knowledge of precautions,Fascial restriction,Sensation,Skin integrity,Pain,IADL,Flexibility,Decreased knowledge of use of DME,Dexterity,FMC,Strength,Wound,ROM,Edema       Visit Diagnosis: Pain in right hand  Stiffness of right hand, not  elsewhere classified  Localized edema    Problem List Patient Active Problem List   Diagnosis Date Noted  . Family history of early CAD 01/03/2016  . Positive GBS test 06/19/2014  . Vaginal delivery 06/19/2014  . AMA (advanced maternal age) multigravida 35+   . Maternal arrhythmia complicating pregnancy   . Polyhydramnios   . Fetal renal anomaly   . PSVT (paroxysmal supraventricular tachycardia) (HCC) 01/21/2014    Collier Salina, OTR/L 06/16/2020, 3:02  PM  [facility] [address] [place], [place], [address] Phone: [phone]   Fax:  [phone]  Name: Holly [name] MRN: [id number] Date of Birth: Aug 15, 1975

## 2020-06-23 ENCOUNTER — Ambulatory Visit: Payer: PRIVATE HEALTH INSURANCE | Attending: Occupational Medicine | Admitting: Occupational Therapy

## 2020-06-23 ENCOUNTER — Other Ambulatory Visit: Payer: Self-pay

## 2020-06-23 DIAGNOSIS — M25641 Stiffness of right hand, not elsewhere classified: Secondary | ICD-10-CM | POA: Insufficient documentation

## 2020-06-23 DIAGNOSIS — R600 Localized edema: Secondary | ICD-10-CM | POA: Insufficient documentation

## 2020-06-23 DIAGNOSIS — M79641 Pain in right hand: Secondary | ICD-10-CM | POA: Insufficient documentation

## 2020-06-23 NOTE — Therapy (Signed)
[facility] [address] [place], [place], [address] Phone: [phone]   Fax:  [phone]  Occupational Therapy Treatment  Patient Details  Name: Holly [name] MRN: [id number] Date of Birth: [birth year] Referring Provider (OT): Dr Lucia Gaskins   Encounter Date: 06/23/2020   OT End of Session - 06/23/20 0858    Visit Number 8    Number of Visits 12    Date for OT Re-Evaluation 06/19/20    Authorization Type Worker's Comp    Authorization Time Period approved 4 visits +8 additional visits    Authorization - Visit Number 8    Authorization - Number of Visits 12    OT Start Time 0850    OT Stop Time 0930    OT Time Calculation (min) 40 min           Past Medical History:  Diagnosis Date  . SVT (supraventricular tachycardia) (HCC)   . Tachycardia   . Vaginal Pap smear, abnormal     Past Surgical History:  Procedure Laterality Date  . LEEP      There were no vitals filed for this visit.   Subjective Assessment - 06/23/20 0857    Subjective  Pt reports taking naproxen    Currently in Pain? Yes    Pain Score 6     Pain Location Finger (Comment which one)    Pain Orientation Right    Pain Descriptors / Indicators Aching    Pain Onset More than a month ago    Pain Frequency Intermittent    Aggravating Factors  movement    Pain Relieving Factors meds                 Treatment:Fluidotherapy x 10 mins to RUE for desensitization and pain relief, no adverse reactions. Tendon gliding exercises x 10 reps  Pt was instructed in  putty HEP. Pt reports washing dishes, and doing laundry now. Pt simulated work activity picking up a 20 lbs crate and carrying 10 ft x 4 reps without drops. Gripper to pick up 1 inch blocks for sustained grip, level 2 with 2 rest breaks Discussed importance of working smarter when she goes back to work, using cart to carry items when possible and taking rest breaks when  able.               OT Education - 06/23/20 1206    Education Details importance of continued desensitization, increasing functional use, red putty HEP    Person(s) Educated Patient    Methods Explanation;Demonstration;Verbal cues    Comprehension Verbalized understanding;Returned demonstration               OT Long Term Goals - 06/23/20 0859      OT LONG TERM GOAL #1   Title Patient will complete edema management techniques independently    Time 4    Period Weeks    Status Achieved      OT LONG TERM GOAL #2   Title Patient will complete HEP designed to improve active and passive range of motion of right hand    Time 4    Period Weeks    Status Achieved      OT LONG TERM GOAL #3   Title Patient will demonstrate at least 75% composite flex/ext in digits 2-5    Time 4    Period Weeks    Status Achieved   not consistent     OT LONG TERM GOAL #4  Title Patient will report pain no more than 3/10 when completing exercise or light functional tasks with RUE    Time 4    Period Weeks    Status On-going   6/10 at today, inconsistent     OT LONG TERM GOAL #5   Title Pt will resume use of RUE as non-dominant hand at least 75% of the time for ADLs/IADLs.    Time 8    Period Weeks    Status On-going   60%                Plan - 06/23/20 1203    Clinical Impression Statement Pt is progressing towards goals, strength and ROM is improving. Pt was encouraged to discuss pain with her MD as she feels this is a limiting factor . Pt returns to work next week. Pt requests to be seen for OT next week prior to d/c.   OT Occupational Profile and History Problem Focused Assessment - Including review of records relating to presenting problem    Occupational performance deficits (Please refer to evaluation for details): ADL's;IADL's;Work    Body Structure / Function / Physical Skills ADL;Coordination;Endurance;Scar mobility;UE functional use;Decreased knowledge of  precautions;Fascial restriction;Sensation;Skin integrity;Pain;IADL;Flexibility;Decreased knowledge of use of DME;Dexterity;FMC;Strength;Wound;ROM;Edema    Rehab Potential Excellent    Clinical Decision Making Limited treatment options, no task modification necessary    Comorbidities Affecting Occupational Performance: None    Modification or Assistance to Complete Evaluation  No modification of tasks or assist necessary to complete eval    OT Frequency 1x / week   or 8 additional visits- approved by [facility]   OT Duration 8 weeks   3 more visits   OT Treatment/Interventions Self-care/ADL training;Therapeutic exercise;Patient/family education;Splinting;Compression bandaging;Moist Heat;Fluidtherapy;Scar mobilization;Therapeutic activities;Manual Therapy;DME and/or AE instruction;Contrast Bath;Ultrasound;Cryotherapy    Plan ROM, gentle strengthening, functional use of hand, anticipate d/c next visit    OT Home Exercise Plan digit blocking, composite flexion/extension    Consulted and Agree with Plan of Care Patient           Patient will benefit from skilled therapeutic intervention in order to improve the following deficits and impairments:   Body Structure / Function / Physical Skills: ADL,Coordination,Endurance,Scar mobility,UE functional use,Decreased knowledge of precautions,Fascial restriction,Sensation,Skin integrity,Pain,IADL,Flexibility,Decreased knowledge of use of DME,Dexterity,FMC,Strength,Wound,ROM,Edema       Visit Diagnosis: Pain in right hand  Stiffness of right hand, not elsewhere classified  Localized edema    Problem List Patient Active Problem List   Diagnosis Date Noted  . Family history of early CAD 01/03/2016  . Positive GBS test 06/19/2014  . Vaginal delivery 06/19/2014  . AMA (advanced maternal age) multigravida 35+   . Maternal arrhythmia complicating pregnancy   . Polyhydramnios   . Fetal renal anomaly   . PSVT (paroxysmal supraventricular tachycardia) (HCC)  01/21/2014    Holly [name] 06/23/2020, 12:08 PM  [facility] [address] [place], [place], [address] Phone: [phone]   Fax:  [phone]  Name: Holly [name] MRN: [id number] Date of Birth: 1975/03/30

## 2020-06-24 DIAGNOSIS — E663 Overweight: Secondary | ICD-10-CM | POA: Diagnosis not present

## 2020-06-30 ENCOUNTER — Ambulatory Visit: Payer: PRIVATE HEALTH INSURANCE | Admitting: Occupational Therapy

## 2020-06-30 ENCOUNTER — Other Ambulatory Visit: Payer: Self-pay

## 2020-06-30 ENCOUNTER — Encounter: Payer: Self-pay | Admitting: Occupational Therapy

## 2020-06-30 DIAGNOSIS — M79641 Pain in right hand: Secondary | ICD-10-CM | POA: Diagnosis not present

## 2020-06-30 DIAGNOSIS — M25641 Stiffness of right hand, not elsewhere classified: Secondary | ICD-10-CM

## 2020-06-30 DIAGNOSIS — R600 Localized edema: Secondary | ICD-10-CM

## 2020-06-30 NOTE — Therapy (Signed)
[facility] [address] [place], [place], [address] Phone: [phone]   Fax:  [phone]  Occupational Therapy Treatment  Patient Details  Name: Holly [name] MRN: [id number] Date of Birth: 21-Dec-1975 Referring Provider (OT): Dr Dannial Monarch   Encounter Date: 06/30/2020  OCCUPATIONAL THERAPY DISCHARGE SUMMARY    Current functional level related to goals / functional outcomes: Pt has achieved all goals.   Remaining deficits: Pain, decreased strength   Education / Equipment: Pt was educated regarding HEP, desensitization, importance of using RUE functionally and importance of using a cart or other equipment that can help her do her job more effectively without injury. Pt verbalized understanding of all education.  Pt was instructed to discuss her pain with MD if it persists, currently it is 2-3/10 at most. Plan: Patient agrees to discharge.  Patient goals were met. Patient is being discharged due to meeting the stated rehab goals.  ?????       OT End of Session - 06/30/20 0812    Visit Number 9    Number of Visits 12    Authorization Type Worker's Comp    Authorization Time Period approved 4 visits +8 additional visits    Authorization - Visit Number 9    Authorization - Number of Visits 12    OT Start Time 0848    OT Stop Time 0930    OT Time Calculation (min) 42 min    Activity Tolerance Patient tolerated treatment well    Behavior During Therapy WFL for tasks assessed/performed           Past Medical History:  Diagnosis Date  . SVT (supraventricular tachycardia) ([place])   . Tachycardia   . Vaginal Pap smear, abnormal     Past Surgical History:  Procedure Laterality Date  . LEEP      There were no vitals filed for this visit.   Subjective Assessment - 06/30/20 0811    Subjective  Pt reports she worked last night    Currently in Pain? Yes    Pain Score 2     Pain Location Finger (Comment  which one)    Pain Orientation Right    Pain Descriptors / Indicators Aching    Pain Type Acute pain    Pain Onset More than a month ago    Pain Frequency Intermittent    Aggravating Factors  movement    Pain Relieving Factors meds              [facility] OT Assessment - 06/30/20 0821      Right Hand AROM   R Little  MCP 0-90 100 Degrees    R Little PIP 0-100 100 Degrees    R Little DIP 0-70 70 Degrees      Hand Function   Right Hand Grip (lbs) 38.3                Treatment:Treatment:Fluidotherapy x 10 mins to RUE for desensitization and pain relief, no adverse reactions. Tendon gliding exercises x 10 reps  Pt simulated work activity picking up a 20 lbs crate and carrying 25 ft  Then lifting from chair to table x 3 and lifting to elevated surface x 1 without drops. Gripper to pick up 1 inch blocks for sustained grip, level 2 for half the blocks then resistance was increased to level 3 for the remainder of the blocks, pt required several rest breaks. Grooved pegs for in hand manipulation with pt holding several  pegs in hand then placing one at a time Discussed importance of working smarter when she goes to work, using cart to carry items when possible and taking rest breaks when able. Upgraded green putty was issued for sustained grip. Pt returned demonstration of use. Therapist checked progress towards goals and reviewed with patient. She verbalized understanding.                   OT Long Term Goals - 06/30/20 0818      OT LONG TERM GOAL #1   Title Patient will complete edema management techniques independently    Time 4    Period Weeks    Status Achieved      OT LONG TERM GOAL #2   Title Patient will complete HEP designed to improve active and passive range of motion of right hand    Time 4    Period Weeks    Status Achieved      OT LONG TERM GOAL #3   Title Patient will demonstrate at least 75% composite flex/ext in digits 2-5    Time 4     Period Weeks    Status Achieved   not consistent     OT LONG TERM GOAL #4   Title Patient will report pain no more than 3/10 when completing exercise or light functional tasks with RUE    Time 4    Period Weeks    Status Achieved   2/10 today with naproxen     OT LONG TERM GOAL #5   Title Pt will resume use of RUE as non-dominant hand at least 75% of the time for ADLs/IADLs.    Time 8    Period Weeks    Status Achieved   75%, grip strength 38.3 lbs                Plan - 06/30/20 3202    Clinical Impression Statement Pt has achieved all goals. She has returned to work activities last night. Pt reports that she is back to cooking, doing laundry and washing dishes at home.    OT Occupational Profile and History Problem Focused Assessment - Including review of records relating to presenting problem    Occupational performance deficits (Please refer to evaluation for details): ADL's;IADL's;Work    Body Structure / Function / Physical Skills ADL;Coordination;Endurance;Scar mobility;UE functional use;Decreased knowledge of precautions;Fascial restriction;Sensation;Skin integrity;Pain;IADL;Flexibility;Decreased knowledge of use of DME;Dexterity;FMC;Strength;Wound;ROM;Edema    Rehab Potential Excellent    Clinical Decision Making Limited treatment options, no task modification necessary    Comorbidities Affecting Occupational Performance: None    Modification or Assistance to Complete Evaluation  No modification of tasks or assist necessary to complete eval    OT Frequency 1x / week   or 8 additional visits- approved by [facility]   OT Duration 8 weeks   3 more visits   OT Treatment/Interventions Self-care/ADL training;Therapeutic exercise;Patient/family education;Splinting;Compression bandaging;Moist Heat;Fluidtherapy;Scar mobilization;Therapeutic activities;Manual Therapy;DME and/or AE instruction;Contrast Bath;Ultrasound;Cryotherapy    Plan d/c OT    OT Home Exercise Plan digit blocking,  composite flexion/extension    Consulted and Agree with Plan of Care Patient           Patient will benefit from skilled therapeutic intervention in order to improve the following deficits and impairments:   Body Structure / Function / Physical Skills: ADL,Coordination,Endurance,Scar mobility,UE functional use,Decreased knowledge of precautions,Fascial restriction,Sensation,Skin integrity,Pain,IADL,Flexibility,Decreased knowledge of use of DME,Dexterity,FMC,Strength,Wound,ROM,Edema       Visit Diagnosis: Pain in right hand  Stiffness  of right hand, not elsewhere classified  Localized edema    Problem List Patient Active Problem List   Diagnosis Date Noted  . Family history of early CAD 01/03/2016  . Positive GBS test 06/19/2014  . Vaginal delivery 06/19/2014  . AMA (advanced maternal age) multigravida 55+   . Maternal arrhythmia complicating pregnancy   . Polyhydramnios   . Fetal renal anomaly   . PSVT (paroxysmal supraventricular tachycardia) ([place]) 01/21/2014    Keionna Kinnaird 06/30/2020, 8:29 AM Theone Murdoch, OTR/L [phone] Phone: [phone] 3:07 PM 06/30/20 [facility] [address] [place], [place], [address] Phone: [phone]   Fax:  [phone]  Name: Holly [name] MRN: [id number] Date of Birth: 11/23/1975

## 2020-07-07 ENCOUNTER — Ambulatory Visit: Payer: 59 | Admitting: Occupational Therapy

## 2020-07-14 ENCOUNTER — Encounter: Payer: 59 | Admitting: Occupational Therapy

## 2020-07-21 ENCOUNTER — Encounter: Payer: 59 | Admitting: Occupational Therapy

## 2020-10-07 DIAGNOSIS — E663 Overweight: Secondary | ICD-10-CM | POA: Diagnosis not present

## 2020-11-11 ENCOUNTER — Telehealth: Payer: 59 | Admitting: Physician Assistant

## 2020-11-11 DIAGNOSIS — J111 Influenza due to unidentified influenza virus with other respiratory manifestations: Secondary | ICD-10-CM | POA: Diagnosis not present

## 2020-11-11 MED ORDER — OSELTAMIVIR PHOSPHATE 75 MG PO CAPS: 75.0000 mg | ORAL_CAPSULE | Freq: Two times a day (BID) | ORAL | 0 refills | Status: DC

## 2020-11-11 NOTE — Progress Notes (Signed)
E visit for Flu like symptoms   We are sorry that you are not feeling well.  Here is how we plan to help! Based on what you have shared with me it looks like you may have a respiratory virus that may be influenza.  Influenza or "the flu" is   an infection caused by a respiratory virus. The flu virus is highly contagious and persons who did not receive their yearly flu vaccination may "catch" the flu from close contact.  We have anti-viral medications to treat the viruses that cause this infection. They are not a "cure" and only shorten the course of the infection. These prescriptions are most effective when they are given within the first 2 days of "flu" symptoms. Antiviral medication are indicated if you have a high risk of complications from the flu. You should  also consider an antiviral medication if you are in close contact with someone who is at risk. These medications can help patients avoid complications from the flu  but have side effects that you should know. Possible side effects from Tamiflu or oseltamivir include nausea, vomiting, diarrhea, dizziness, headaches, eye redness, sleep problems or other respiratory symptoms. You should not take Tamiflu if you have an allergy to oseltamivir or any to the ingredients in Tamiflu.  Based upon your symptoms and potential risk factors I have prescribed Oseltamivir (Tamiflu).  It has been sent to your designated pharmacy.  You will take one 75 mg capsule orally twice a day for the next 5 days.  ANYONE WHO HAS FLU SYMPTOMS SHOULD: Stay home. The flu is highly contagious and going out or to work exposes others! Be sure to drink plenty of fluids. Water is fine as well as fruit juices, sodas and electrolyte beverages. You may want to stay away from caffeine or alcohol. If you are nauseated, try taking small sips of liquids. How do you know if you are getting enough fluid? Your urine should be a pale yellow or almost colorless. Get rest. Taking a steamy  shower or using a humidifier may help nasal congestion and ease sore throat pain. Using a saline nasal spray works much the same way. Cough drops, hard candies and sore throat lozenges may ease your cough. Line up a caregiver. Have someone check on you regularly.   GET HELP RIGHT AWAY IF: You cannot keep down liquids or your medications. You become short of breath Your fell like you are going to pass out or loose consciousness. Your symptoms persist after you have completed your treatment plan MAKE SURE YOU  Understand these instructions. Will watch your condition. Will get help right away if you are not doing well or get worse.  Your e-visit answers were reviewed by a board certified advanced clinical practitioner to complete your personal care plan.  Depending on the condition, your plan could have included both over the counter or prescription medications.  If there is a problem please reply  once you have received a response from your provider.  Your safety is important to us.  If you have drug allergies check your prescription carefully.    You can use MyChart to ask questions about today's visit, request a non-urgent call back, or ask for a work or school excuse for 24 hours related to this e-Visit. If it has been greater than 24 hours you will need to follow up with your provider, or enter a new e-Visit to address those concerns.  You will get an e-mail in the next   two days asking about your experience.  I hope that your e-visit has been valuable and will speed your recovery. Thank you for using e-visits.  I provided 5 minutes of non face-to-face time during this encounter for chart review and documentation.   

## 2020-11-12 DIAGNOSIS — J029 Acute pharyngitis, unspecified: Secondary | ICD-10-CM | POA: Diagnosis not present

## 2020-11-12 DIAGNOSIS — R509 Fever, unspecified: Secondary | ICD-10-CM | POA: Diagnosis not present

## 2020-11-19 ENCOUNTER — Other Ambulatory Visit: Payer: Self-pay | Admitting: Internal Medicine

## 2020-11-19 ENCOUNTER — Ambulatory Visit
Admission: RE | Admit: 2020-11-19 | Discharge: 2020-11-19 | Disposition: A | Discharge status: Home / Self Care | Payer: 59 | Source: Ambulatory Visit | Attending: Internal Medicine | Admitting: Internal Medicine

## 2020-11-19 DIAGNOSIS — R0789 Other chest pain: Secondary | ICD-10-CM | POA: Diagnosis not present

## 2020-11-19 DIAGNOSIS — R52 Pain, unspecified: Secondary | ICD-10-CM | POA: Diagnosis not present

## 2020-11-19 DIAGNOSIS — R5383 Other fatigue: Secondary | ICD-10-CM | POA: Diagnosis not present

## 2020-11-19 DIAGNOSIS — D649 Anemia, unspecified: Secondary | ICD-10-CM | POA: Diagnosis not present

## 2020-11-23 DIAGNOSIS — J069 Acute upper respiratory infection, unspecified: Secondary | ICD-10-CM | POA: Diagnosis not present

## 2020-11-23 DIAGNOSIS — Z03818 Encounter for observation for suspected exposure to other biological agents ruled out: Secondary | ICD-10-CM | POA: Diagnosis not present

## 2020-12-23 ENCOUNTER — Other Ambulatory Visit (HOSPITAL_COMMUNITY): Payer: Self-pay

## 2020-12-23 DIAGNOSIS — E669 Obesity, unspecified: Secondary | ICD-10-CM | POA: Diagnosis not present

## 2020-12-23 DIAGNOSIS — I10 Essential (primary) hypertension: Secondary | ICD-10-CM | POA: Diagnosis not present

## 2020-12-23 DIAGNOSIS — Z131 Encounter for screening for diabetes mellitus: Secondary | ICD-10-CM | POA: Diagnosis not present

## 2020-12-23 DIAGNOSIS — R5382 Chronic fatigue, unspecified: Secondary | ICD-10-CM | POA: Diagnosis not present

## 2020-12-23 DIAGNOSIS — Z13220 Encounter for screening for lipoid disorders: Secondary | ICD-10-CM | POA: Diagnosis not present

## 2020-12-23 DIAGNOSIS — A6004 Herpesviral vulvovaginitis: Secondary | ICD-10-CM | POA: Diagnosis not present

## 2020-12-23 DIAGNOSIS — E282 Polycystic ovarian syndrome: Secondary | ICD-10-CM | POA: Diagnosis not present

## 2020-12-23 DIAGNOSIS — Z0000 Encounter for general adult medical examination without abnormal findings: Secondary | ICD-10-CM | POA: Diagnosis not present

## 2020-12-23 MED ORDER — VALACYCLOVIR HCL 500 MG PO TABS
500.0000 mg | ORAL_TABLET | Freq: Two times a day (BID) | ORAL | 1 refills | Status: AC | PRN
  Filled 2020-12-23: qty 30, 90d supply, fill #0

## 2021-09-13 ENCOUNTER — Encounter (HOSPITAL_COMMUNITY): Payer: Self-pay | Admitting: Emergency Medicine

## 2021-09-13 ENCOUNTER — Ambulatory Visit (HOSPITAL_COMMUNITY)
Admission: EM | Admit: 2021-09-13 | Discharge: 2021-09-13 | Disposition: A | Discharge status: Home / Self Care | Payer: 59 | Attending: Family Medicine | Admitting: Family Medicine

## 2021-09-13 DIAGNOSIS — R52 Pain, unspecified: Secondary | ICD-10-CM | POA: Diagnosis present

## 2021-09-13 DIAGNOSIS — R509 Fever, unspecified: Secondary | ICD-10-CM | POA: Insufficient documentation

## 2021-09-13 DIAGNOSIS — U071 COVID-19: Secondary | ICD-10-CM | POA: Insufficient documentation

## 2021-09-13 LAB — RESP PANEL BY RT-PCR (FLU A&B, COVID) ARPGX2
Influenza A by PCR: NEGATIVE
Influenza B by PCR: NEGATIVE
SARS Coronavirus 2 by RT PCR: POSITIVE — AB

## 2021-09-13 NOTE — ED Provider Notes (Signed)
[facility]    CSN: [id number] Arrival date & time: 09/13/21  1141      History   Chief Complaint No chief complaint on file.   HPI Holly [name] is a 46 y.o. female.   Patient is here with body aches since yesterday.  Feeling more fatigued than normal.  Worsening body aches, headaches since last night.  Low grade fever, chills. + congestion.  No cough.  Mild nausea yesterday.  No vomiting or diarrhea.  No known sick contacts.  No otc meds taken.   Past Medical History:  Diagnosis Date   SVT (supraventricular tachycardia) (HCC)    Tachycardia    Vaginal Pap smear, abnormal     Patient Active Problem List   Diagnosis Date Noted   Family history of early CAD 01/03/2016   Positive GBS test 06/19/2014   Vaginal delivery 06/19/2014   AMA (advanced maternal age) multigravida 35+    Maternal arrhythmia complicating pregnancy    Polyhydramnios    Fetal renal anomaly    PSVT (paroxysmal supraventricular tachycardia) (HCC) 01/21/2014    Past Surgical History:  Procedure Laterality Date   LEEP      OB History     Gravida  4   Para  3   Term  3   Preterm  0   AB  1   Living  2      SAB  1   IAB  0   Ectopic  0   Multiple  0   Live Births  2            Home Medications    Prior to Admission medications   Medication Sig Start Date End Date Taking? Authorizing Provider  metoprolol succinate (TOPROL-XL) 50 MG 24 hr tablet Take 1 tablet (50 mg total) by mouth daily. Take with or immediately following a meal. 07/22/18 10/20/18  Georgie Chard D, NP  naproxen (NAPROSYN) 500 MG tablet Take 1 tablet (500 mg total) by mouth 2 (two) times daily with a meal. 04/24/20   Wallis Bamberg, PA-C  oseltamivir (TAMIFLU) 75 MG capsule Take 1 capsule (75 mg total) by mouth 2 (two) times daily. 11/11/20   Margaretann Loveless, PA-C  traMADol (ULTRAM) 50 MG tablet Take 1 tablet (50 mg total) by mouth every 8 hours as  needed for severe pain 05/03/20   Logan Bores, MD  valACYclovir (VALTREX) 500 MG tablet Take 1 tablet (500 mg total) by mouth every 12 (twelve) hours for 3 days as needed 12/23/20       Family History Family History  Problem Relation Age of Onset   Heart disease Mother    Hypertension Mother    Diabetes Father    Heart failure Maternal Grandmother     Social History Social History   Tobacco Use   Smoking status: Never   Smokeless tobacco: Never  Vaping Use   Vaping Use: Never used  Substance Use Topics   Alcohol use: No   Drug use: No     Allergies   Amoxicillin   Review of Systems Review of Systems  Constitutional:  Positive for chills and fever.  HENT:  Positive for congestion.   Respiratory: Negative.    Cardiovascular: Negative.   Gastrointestinal:  Positive for abdominal pain and nausea.  Genitourinary: Negative.   Musculoskeletal: Negative.   Skin: Negative.   Psychiatric/Behavioral: Negative.       Physical Exam Triage Vital Signs ED Triage Vitals  Enc Vitals  Group     BP 09/13/21 1216 (!) 154/98     Pulse Rate 09/13/21 1216 93     Resp 09/13/21 1216 17     Temp 09/13/21 1216 (!) 100.8 F (38.2 C)     Temp Source 09/13/21 1216 Oral     SpO2 09/13/21 1216 99 %     Weight --      Height --      Head Circumference --      Peak Flow --      Pain Score 09/13/21 1215 5     Pain Loc --      Pain Edu? --      Excl. in GC? --    No data found.  Updated Vital Signs BP (!) 154/98 (BP Location: Right Arm)   Pulse 93   Temp (!) 100.8 F (38.2 C) (Oral)   Resp 17   LMP 09/11/2021   SpO2 99%   Visual Acuity Right Eye Distance:   Left Eye Distance:   Bilateral Distance:    Right Eye Near:   Left Eye Near:    Bilateral Near:     Physical Exam Constitutional:      Appearance: Normal appearance.  HENT:     Nose: Nose normal. No congestion or rhinorrhea.  Cardiovascular:     Rate and Rhythm: Normal rate and regular rhythm.  Pulmonary:     Effort: Pulmonary effort is normal.      Breath sounds: Normal breath sounds.  Abdominal:     Palpations: Abdomen is soft.     Tenderness: There is abdominal tenderness. There is no guarding or rebound.  Musculoskeletal:     Cervical back: Normal range of motion and neck supple.  Skin:    General: Skin is warm.  Neurological:     General: No focal deficit present.     Mental Status: She is alert.  Psychiatric:        Mood and Affect: Mood normal.        Behavior: Behavior normal.      UC Treatments / Results  Labs (all labs ordered are listed, but only abnormal results are displayed) Labs Reviewed - No data to display  EKG   Radiology No results found.  Procedures Procedures (including critical care time)  Medications Ordered in UC Medications - No data to display  Initial Impression / Assessment and Plan / UC Course  I have reviewed the triage vital signs and the nursing notes.  Pertinent labs & imaging results that were available during my care of the patient were reviewed by me and considered in my medical decision making (see chart for details).  Patient seen for fever, body aches, worry for covid and flu.  This was tested for today.  She would like to start treatment if positive.  Would recommend molnupiravir to start if needed.  Tylenol for pain at this time Follow up with pcp to discussed bp issues.    Final Clinical Impressions(s) / UC Diagnoses   Final diagnoses:  Fever, unspecified fever cause  Body aches     Discharge Instructions      You were seen today for fever, chills and body aches.  I have tested you for flu and covid today.  These will be resulted in the next 24 hrs and we will call you if positive to discuss treatment options.  Please take over the counter tylenol for pain and fever.  Avoid motrin/aleve other nsaids as they may raise  your blood pressure.  Please follow up with your primary care provider to discuss blood pressure issues.      ED Prescriptions   None     PDMP not reviewed this encounter.   Jannifer Franklin, MD 09/13/21 1322

## 2021-09-13 NOTE — Discharge Instructions (Addendum)
You were seen today for fever, chills and body aches.  I have tested you for flu and covid today.  These will be resulted in the next 24 hrs and we will call you if positive to discuss treatment options.  Please take over the counter tylenol for pain and fever.  Avoid motrin/aleve other nsaids as they may raise your blood pressure.  Please follow up with your primary care provider to discuss blood pressure issues.

## 2021-09-13 NOTE — ED Triage Notes (Signed)
Pt requesting covid and flu test.  Pt having body aches, stuffy nose, headache that started yesterday.

## 2021-09-14 ENCOUNTER — Telehealth (HOSPITAL_COMMUNITY): Payer: Self-pay | Admitting: Emergency Medicine

## 2021-09-14 ENCOUNTER — Other Ambulatory Visit (HOSPITAL_COMMUNITY): Payer: Self-pay

## 2021-09-14 MED ORDER — MOLNUPIRAVIR EUA 200MG CAPSULE
4.0000 | ORAL_CAPSULE | Freq: Two times a day (BID) | ORAL | 0 refills | Status: AC
  Filled 2021-09-14: qty 40, 5d supply, fill #0

## 2022-02-07 ENCOUNTER — Other Ambulatory Visit (HOSPITAL_COMMUNITY): Payer: Self-pay

## 2022-02-07 DIAGNOSIS — E282 Polycystic ovarian syndrome: Secondary | ICD-10-CM | POA: Diagnosis not present

## 2022-02-07 DIAGNOSIS — J069 Acute upper respiratory infection, unspecified: Secondary | ICD-10-CM | POA: Diagnosis not present

## 2022-02-07 DIAGNOSIS — A6004 Herpesviral vulvovaginitis: Secondary | ICD-10-CM | POA: Diagnosis not present

## 2022-02-07 DIAGNOSIS — Z0000 Encounter for general adult medical examination without abnormal findings: Secondary | ICD-10-CM | POA: Diagnosis not present

## 2022-02-07 DIAGNOSIS — I10 Essential (primary) hypertension: Secondary | ICD-10-CM | POA: Diagnosis not present

## 2022-02-07 MED ORDER — CEFUROXIME AXETIL 500 MG PO TABS
500.0000 mg | ORAL_TABLET | Freq: Two times a day (BID) | ORAL | 0 refills | Status: AC
  Filled 2022-02-07: qty 10, 5d supply, fill #0

## 2022-03-10 ENCOUNTER — Other Ambulatory Visit (HOSPITAL_COMMUNITY): Payer: Self-pay

## 2022-03-10 DIAGNOSIS — Z20828 Contact with and (suspected) exposure to other viral communicable diseases: Secondary | ICD-10-CM | POA: Diagnosis not present

## 2022-03-10 DIAGNOSIS — Z6834 Body mass index (BMI) 34.0-34.9, adult: Secondary | ICD-10-CM | POA: Diagnosis not present

## 2022-03-10 DIAGNOSIS — R059 Cough, unspecified: Secondary | ICD-10-CM | POA: Diagnosis not present

## 2022-03-10 DIAGNOSIS — I10 Essential (primary) hypertension: Secondary | ICD-10-CM | POA: Diagnosis not present

## 2022-03-10 DIAGNOSIS — Z20822 Contact with and (suspected) exposure to covid-19: Secondary | ICD-10-CM | POA: Diagnosis not present

## 2022-03-10 DIAGNOSIS — J0140 Acute pansinusitis, unspecified: Secondary | ICD-10-CM | POA: Diagnosis not present

## 2022-03-10 DIAGNOSIS — Z713 Dietary counseling and surveillance: Secondary | ICD-10-CM | POA: Diagnosis not present

## 2022-03-10 MED ORDER — PREDNISONE 5 MG PO TABS
ORAL_TABLET | ORAL | 0 refills | Status: AC
  Filled 2022-03-10: qty 21, 6d supply, fill #0

## 2022-03-10 MED ORDER — AZITHROMYCIN 250 MG PO TABS
ORAL_TABLET | ORAL | 0 refills | Status: AC
  Filled 2022-03-10: qty 6, 5d supply, fill #0

## 2023-02-15 ENCOUNTER — Other Ambulatory Visit: Payer: Self-pay

## 2023-02-15 ENCOUNTER — Other Ambulatory Visit (HOSPITAL_COMMUNITY): Payer: Self-pay

## 2023-02-15 DIAGNOSIS — Z01419 Encounter for gynecological examination (general) (routine) without abnormal findings: Secondary | ICD-10-CM | POA: Diagnosis not present

## 2023-02-15 DIAGNOSIS — Z1231 Encounter for screening mammogram for malignant neoplasm of breast: Secondary | ICD-10-CM

## 2023-02-15 DIAGNOSIS — Z1151 Encounter for screening for human papillomavirus (HPV): Secondary | ICD-10-CM | POA: Diagnosis not present

## 2023-02-15 DIAGNOSIS — Z124 Encounter for screening for malignant neoplasm of cervix: Secondary | ICD-10-CM | POA: Diagnosis not present

## 2023-02-15 MED ORDER — VALACYCLOVIR HCL 500 MG PO TABS
500.0000 mg | ORAL_TABLET | Freq: Two times a day (BID) | ORAL | 3 refills | Status: AC
  Filled 2023-02-15: qty 180, 90d supply, fill #0

## 2023-03-06 IMAGING — CR DG CHEST 2V
2 series · 2 of 2 positions shown · non-contrast
Comparison: None.

CLINICAL DATA: Atypical chest pain.

EXAM:
CHEST - 2 VIEW

[w chest pa *]
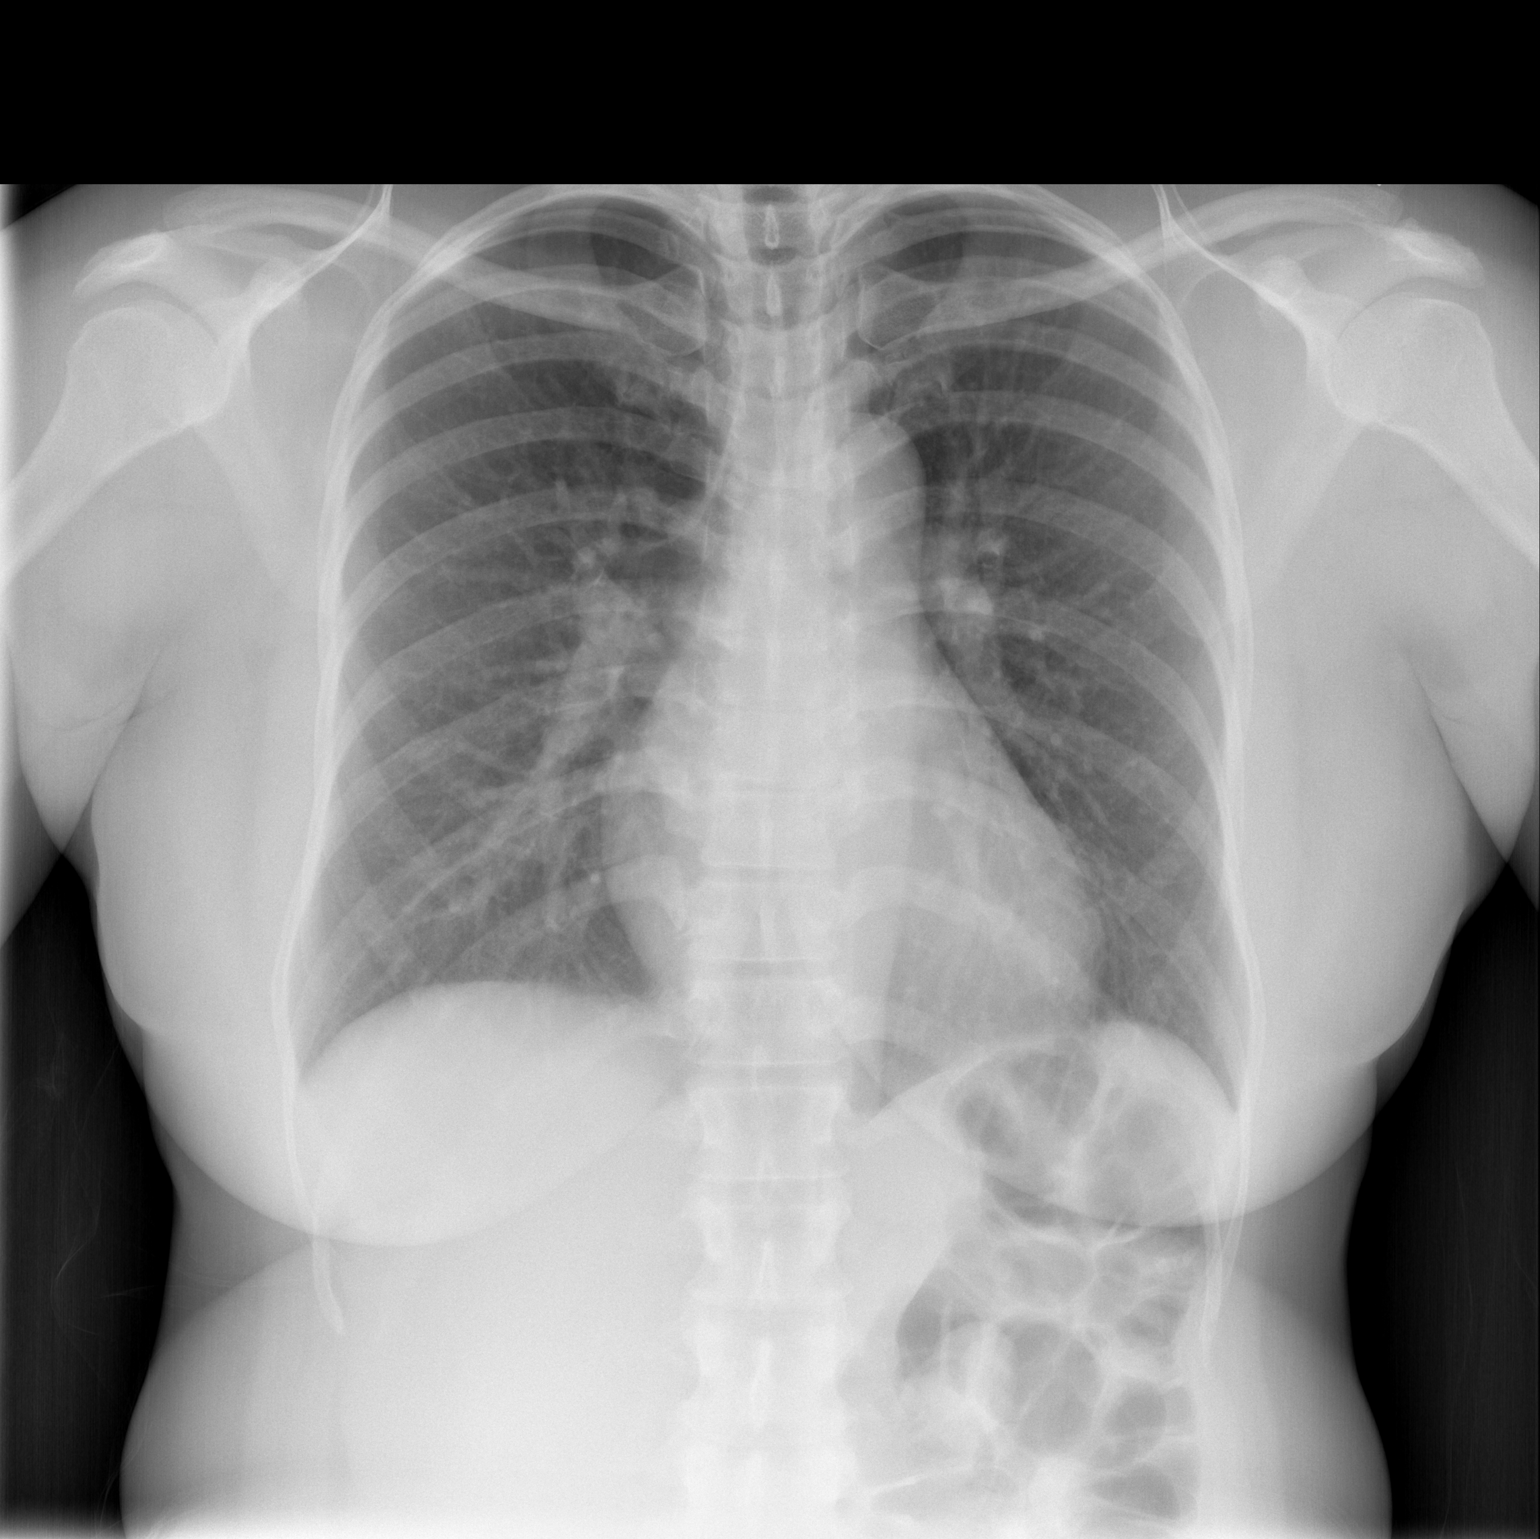

[w chest lat *]
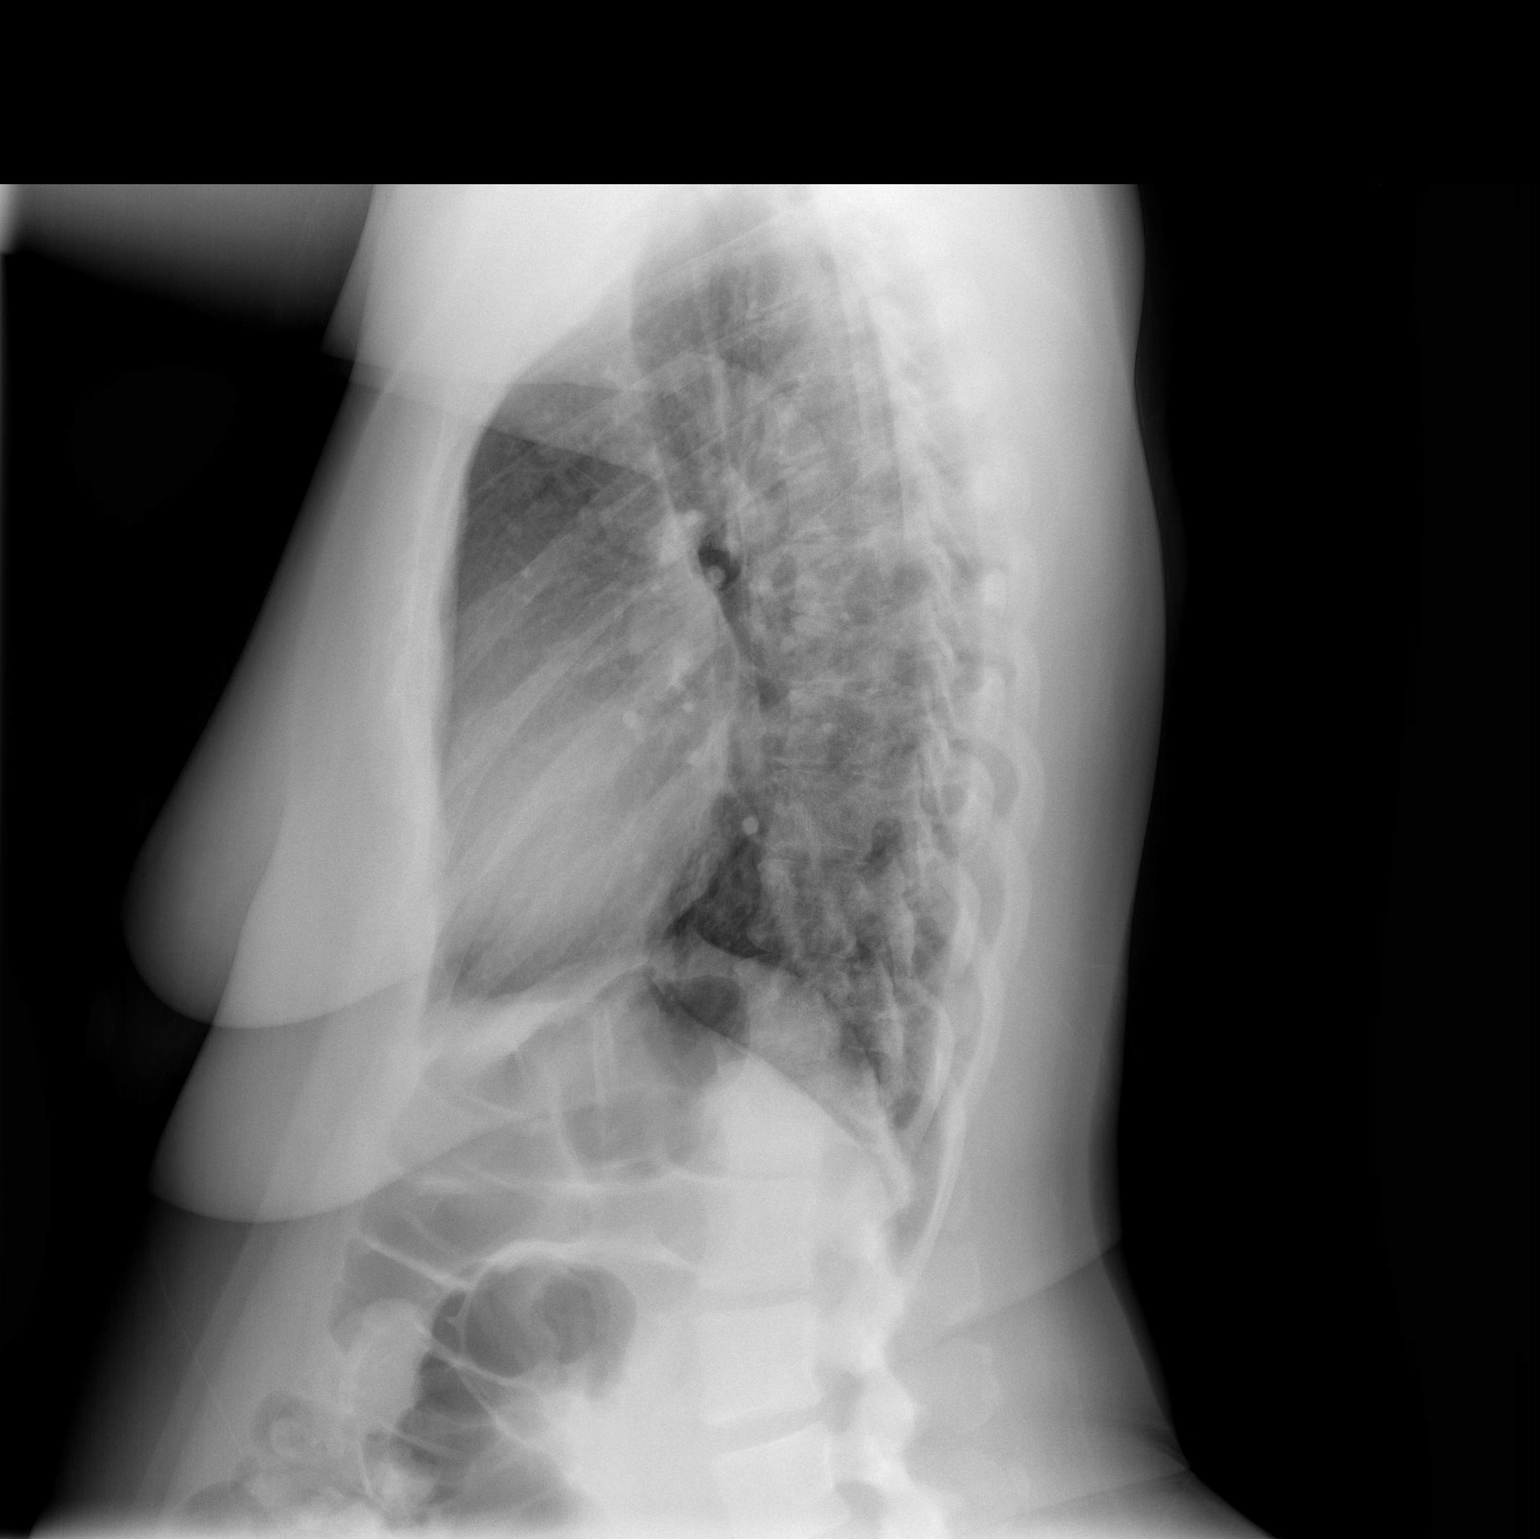

[2 of 2 positions shown; findings below may reference images not displayed]

FINDINGS: The cardiomediastinal contours are normal. The lungs are clear.
Pulmonary vasculature is normal. No consolidation, pleural effusion,
or pneumothorax. No acute osseous abnormalities are seen.
IMPRESSION: Negative radiographs of the chest.

## 2023-03-09 ENCOUNTER — Ambulatory Visit
Admission: RE | Admit: 2023-03-09 | Discharge: 2023-03-09 | Disposition: A | Discharge status: Home / Self Care | Payer: BC Managed Care – PPO | Source: Ambulatory Visit | Attending: Internal Medicine | Admitting: Internal Medicine

## 2023-03-09 DIAGNOSIS — Z1231 Encounter for screening mammogram for malignant neoplasm of breast: Secondary | ICD-10-CM | POA: Diagnosis not present

## 2023-03-30 DIAGNOSIS — Z3009 Encounter for other general counseling and advice on contraception: Secondary | ICD-10-CM | POA: Diagnosis not present

## 2023-04-20 DIAGNOSIS — Z3202 Encounter for pregnancy test, result negative: Secondary | ICD-10-CM | POA: Diagnosis not present

## 2023-04-20 DIAGNOSIS — Z30431 Encounter for routine checking of intrauterine contraceptive device: Secondary | ICD-10-CM | POA: Diagnosis not present

## 2023-05-07 DIAGNOSIS — T8332XA Displacement of intrauterine contraceptive device, initial encounter: Secondary | ICD-10-CM | POA: Diagnosis not present

## 2023-05-07 DIAGNOSIS — Z30431 Encounter for routine checking of intrauterine contraceptive device: Secondary | ICD-10-CM | POA: Diagnosis not present

## 2023-05-30 DIAGNOSIS — Z30433 Encounter for removal and reinsertion of intrauterine contraceptive device: Secondary | ICD-10-CM | POA: Diagnosis not present

## 2023-05-30 DIAGNOSIS — Z3202 Encounter for pregnancy test, result negative: Secondary | ICD-10-CM | POA: Diagnosis not present

## 2023-05-30 DIAGNOSIS — T8332XA Displacement of intrauterine contraceptive device, initial encounter: Secondary | ICD-10-CM | POA: Diagnosis not present

## 2023-07-25 DIAGNOSIS — T8339XA Other mechanical complication of intrauterine contraceptive device, initial encounter: Secondary | ICD-10-CM | POA: Diagnosis not present

## 2023-07-25 DIAGNOSIS — Z30431 Encounter for routine checking of intrauterine contraceptive device: Secondary | ICD-10-CM | POA: Diagnosis not present

## 2023-08-01 DIAGNOSIS — Z30431 Encounter for routine checking of intrauterine contraceptive device: Secondary | ICD-10-CM | POA: Diagnosis not present

## 2023-08-01 DIAGNOSIS — T8332XA Displacement of intrauterine contraceptive device, initial encounter: Secondary | ICD-10-CM | POA: Diagnosis not present
